# Patient Record
Sex: Female | Born: 1990
Health system: Southern US, Community
[De-identification: ages and names within clinical notes are randomized; demographics above are authoritative.]

## PROBLEM LIST (undated history)

## (undated) DIAGNOSIS — F419 Anxiety disorder, unspecified: Secondary | ICD-10-CM

## (undated) DIAGNOSIS — L709 Acne, unspecified: Secondary | ICD-10-CM

## (undated) DIAGNOSIS — F32A Depression, unspecified: Secondary | ICD-10-CM

## (undated) DIAGNOSIS — F329 Major depressive disorder, single episode, unspecified: Secondary | ICD-10-CM

## (undated) DIAGNOSIS — R519 Headache, unspecified: Secondary | ICD-10-CM

## (undated) DIAGNOSIS — N809 Endometriosis, unspecified: Secondary | ICD-10-CM

## (undated) DIAGNOSIS — R51 Headache: Secondary | ICD-10-CM

## (undated) HISTORY — DX: Acne, unspecified: L70.9

## (undated) HISTORY — DX: Depression, unspecified: F32.A

## (undated) HISTORY — PX: WISDOM TOOTH EXTRACTION: SHX21

## (undated) HISTORY — DX: Major depressive disorder, single episode, unspecified: F32.9

---
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
## 2012-07-30 ENCOUNTER — Other Ambulatory Visit (INDEPENDENT_AMBULATORY_CARE_PROVIDER_SITE_OTHER): Payer: 59

## 2012-07-30 ENCOUNTER — Ambulatory Visit (INDEPENDENT_AMBULATORY_CARE_PROVIDER_SITE_OTHER): Payer: 59 | Admitting: Internal Medicine

## 2012-07-30 ENCOUNTER — Encounter: Payer: Self-pay | Admitting: Internal Medicine

## 2012-07-30 VITALS — BP 110/70 | HR 71 | Temp 98.1°F | Ht 64.0 in | Wt 139.4 lb

## 2012-07-30 DIAGNOSIS — L709 Acne, unspecified: Secondary | ICD-10-CM | POA: Insufficient documentation

## 2012-07-30 DIAGNOSIS — L708 Other acne: Secondary | ICD-10-CM

## 2012-07-30 DIAGNOSIS — Z0000 Encounter for general adult medical examination without abnormal findings: Secondary | ICD-10-CM

## 2012-07-30 DIAGNOSIS — G43909 Migraine, unspecified, not intractable, without status migrainosus: Secondary | ICD-10-CM

## 2012-07-30 DIAGNOSIS — Z23 Encounter for immunization: Secondary | ICD-10-CM

## 2012-07-30 DIAGNOSIS — F329 Major depressive disorder, single episode, unspecified: Secondary | ICD-10-CM

## 2012-07-30 LAB — URINALYSIS, ROUTINE W REFLEX MICROSCOPIC
Bilirubin Urine: NEGATIVE
Ketones, ur: NEGATIVE
Leukocytes, UA: NEGATIVE
Urobilinogen, UA: 0.2 (ref 0.0–1.0)
pH: 6 (ref 5.0–8.0)

## 2012-07-30 LAB — LIPID PANEL
HDL: 65.1 mg/dL (ref >39.00)
Total CHOL/HDL Ratio: 2
VLDL: 19 mg/dL (ref 0.0–40.0)

## 2012-07-30 LAB — HEPATIC FUNCTION PANEL
AST: 19 U/L (ref 0–37)
Total Bilirubin: 0.3 mg/dL (ref 0.3–1.2)

## 2012-07-30 LAB — CBC WITH DIFFERENTIAL/PLATELET
Basophils Absolute: 0 10*3/uL (ref 0.0–0.1)
Eosinophils Absolute: 0.1 10*3/uL (ref 0.0–0.7)
HCT: 38.6 % (ref 36.0–46.0)
Hemoglobin: 12.8 g/dL (ref 12.0–15.0)
Lymphs Abs: 2.2 10*3/uL (ref 0.7–4.0)
MCHC: 33.3 g/dL (ref 30.0–36.0)
Neutro Abs: 4.1 10*3/uL (ref 1.4–7.7)
RDW: 13.7 % (ref 11.5–14.6)

## 2012-07-30 LAB — BASIC METABOLIC PANEL
CO2: 25 mEq/L (ref 19–32)
Glucose, Bld: 69 mg/dL — ABNORMAL LOW (ref 70–99)
Potassium: 4.5 mEq/L (ref 3.5–5.1)
Sodium: 137 mEq/L (ref 135–145)

## 2012-07-30 MED ORDER — METRONIDAZOLE 1 % EX GEL: Freq: Every day | CUTANEOUS | Status: DC

## 2012-07-30 NOTE — Assessment & Plan Note (Signed)
Never on meds for same - in counseling prn - at ome and at school ([employer]) Current symptoms stable - Pt agrees to call if change or problems

## 2012-07-30 NOTE — Progress Notes (Signed)
  Subjective:    Patient ID: Jennifer Parks, female    DOB: 01/27/1991, 21 y.o.   MRN: [id number]  HPI  New pt to me and our practice, here to establish care patient is here today for annual physical. Patient feels well overall  Concerned about acne- face only On Duac wash until summer 2013 with good control -  recent flare in past 4 weeks prompting resumed med use but "not working like before"  Past Medical History  Diagnosis Date  . Migraines   . Depression    Family History  Problem Relation Age of Onset  . Mental illness Mother   . Hyperlipidemia Father   . Lung cancer Maternal Grandfather   . Mental illness Paternal Grandmother   . Hyperlipidemia Paternal Grandfather   . Heart disease Paternal Grandfather    History  Substance Use Topics  . Smoking status: Never Smoker   . Smokeless tobacco: Not on file     Comment: single - student at [employer]  . Alcohol Use: Yes    Review of Systems Constitutional: Negative for fever or weight change.  Respiratory: Negative for cough and shortness of breath.   Cardiovascular: Negative for chest pain or palpitations.  Gastrointestinal: Negative for abdominal pain, no bowel changes.  Musculoskeletal: Negative for gait problem or joint swelling.  Skin: Negative for rash.  Neurological: Negative for dizziness or headache.  No other specific complaints in a complete review of systems (except as listed in HPI above).     Objective:   Physical Exam BP 110/70  Pulse 71  Temp 98.1 F (36.7 C) (Oral)  Ht 5\' 4"  (1.626 m)  Wt 139 lb 6.4 oz (63.231 kg)  BMI 23.93 kg/m2  SpO2 98% Wt Readings from Last 3 Encounters:  07/30/12 139 lb 6.4 oz (63.231 kg)   Constitutional: She appears well-developed and well-nourished. No distress.  HENT: Head: Normocephalic and atraumatic. Ears: B TMs ok, no erythema or effusion; Nose: Nose normal. Mouth/Throat: Oropharynx is clear and moist. No oropharyngeal exudate.  Eyes: Conjunctivae and EOM are  normal. Pupils are equal, round, and reactive to light. No scleral icterus.  Neck: Normal range of motion. Neck supple. No JVD present. No thyromegaly present.  Cardiovascular: Normal rate, regular rhythm and normal heart sounds.  No murmur heard. No BLE edema. Pulmonary/Chest: Effort normal and breath sounds normal. No respiratory distress. She has no wheezes.  Abdominal: Soft. Bowel sounds are normal. She exhibits no distension. There is no tenderness. no masses Musculoskeletal: Normal range of motion, no joint effusions. No gross deformities Neurological: She is alert and oriented to person, place, and time. No cranial nerve deficit. Coordination normal.  Skin: acne - lower face without cellulitis -remaining skin is warm and dry. No rash noted. No erythema.  Psychiatric: She has a normal mood and affect. Her behavior is normal. Judgment and thought content normal.   No results found for this basename: WBC, HGB, HCT, PLT, GLUCOSE, CHOL, TRIG, HDL, LDLDIRECT, LDLCALC, ALT, AST, NA, K, CL, CREATININE, BUN, CO2, TSH, PSA, INR, GLUF, HGBA1C, MICROALBUR       Assessment & Plan:   CPX/v70.0- Patient has been counseled on age-appropriate routine health concerns for screening and prevention. These are reviewed and up-to-date. Immunizations are up-to-date or declined. Labs ordered and reviewed.

## 2012-07-30 NOTE — Assessment & Plan Note (Signed)
On OCP - likely precipitated with weather change and yearend semester stressors Change duac to metrogel - erx done Education and reassurance provided

## 2012-07-30 NOTE — Assessment & Plan Note (Signed)
Precipitated by accidental trauma/mild concussion early 2012 (snowboard injury) S/p neuro eval ([facility]) Started topamax summer 2013 - much improved with rare incidents The current medical regimen is effective;  continue present plan and medications.

## 2012-07-30 NOTE — Patient Instructions (Signed)
It was good to see you today. We have reviewed your prior records including labs and tests today Health Maintenance reviewed - check on date of your last tetanus shot -flu shot today -all other recommended immunizations and age-appropriate screenings are up-to-date. Test(s) ordered today. Your results will be released to MyChart (or called to you) after review, usually within 72hours after test completion. If any changes need to be made, you will be notified at that same time. Start Metrogel daily for acne - Your prescription(s) have been submitted to your pharmacy. Please take as directed and contact our office if you believe you are having problem(s) with the medication(s). Other Medications reviewed, no other changes at this time.  Continue working with your other specialists Please schedule followup in 1-2 years, call sooner if problems. Health Maintenance, 56- to 20-Year-Old SCHOOL PERFORMANCE After high school completion, the young adult may be attending college, Scientist, product/process development or vocational school, or entering the [employer] or the work force. SOCIAL AND EMOTIONAL DEVELOPMENT The young adult establishes adult relationships and explores sexual identity. Young adults may be living at home or in a college dorm or apartment. Increasing independence is important with young adults. Throughout adolescence, teens should assume responsibility of their own health care. IMMUNIZATIONS Most young adults should be fully vaccinated. A booster dose of Tdap (tetanus, diphtheria, and pertussis, or "whooping cough"), a dose of meningococcal vaccine to protect against a certain type of bacterial meningitis, hepatitis A, human papillomarvirus (HPV), chickenpox, or measles vaccines may be indicated, if not given at an earlier age. Annual influenza or "flu" vaccination should be considered during flu season.   TESTING Annual screening for vision and hearing problems is recommended. Vision should be screened objectively at  least once between 81 and 36 years of age. The young adult may be screened for anemia or tuberculosis. Young adults should have a blood test to check for high cholesterol during this time period. Young adults should be screened for use of alcohol and drugs. If the young adult is sexually active, screening for sexually transmitted infections, pregnancy, or HIV may be performed. Screening for cervical cancer should be performed within 3 years of beginning sexual activity. NUTRITION AND ORAL HEALTH  Adequate calcium intake is important. Consume 3 servings of low-fat milk and dairy products daily. For those who do not drink milk or consume dairy products, calcium enriched foods, such as juice, bread, or cereal, dark, leafy greens, or canned fish are alternate sources of calcium.   Drink plenty of water. Limit fruit juice to 8 to 12 ounces per day. Avoid sugary beverages or sodas.   Discourage skipping meals, especially breakfast. Teens should eat a good variety of vegetables and fruits, as well as lean meats.   Avoid high fat, high salt, and high sugar foods, such as candy, chips, and cookies.   Encourage young adults to participate in meal planning and preparation.   Eat meals together as a family whenever possible. Encourage conversation at mealtime.   Limit fast food choices and eating out at restaurants.   Brush teeth twice a day and floss.   Schedule dental exams twice a year.  SLEEP Regular sleep habits are important. PHYSICAL, SOCIAL, AND EMOTIONAL DEVELOPMENT  One hour of regular physical activity daily is recommended. Continue to participate in sports.   Encourage young adults to develop their own interests and consider community service or volunteerism.   Provide guidance to the young adult in making decisions about college and work plans.  Make sure that young adults know that they should never be in a situation that makes them uncomfortable, and they should tell partners if  they do not want to engage in sexual activity.   Talk to the young adult about body image. Eating disorders may be noted at this time. Young adults may also be concerned about being overweight. Monitor the young adult for weight gain or loss.   Mood disturbances, depression, anxiety, alcoholism, or attention problems may be noted in young adults. Talk to the caregiver if there are concerns about mental illness.   Negotiate limit setting and independent decision making.   Encourage the young adult to handle conflict without physical violence.   Avoid loud noises which may impair hearing.   Limit television and computer time to 2 hours per day. Individuals who engage in excessive sedentary activity are more likely to become overweight.  RISK BEHAVIORS  Sexually active young adults need to take precautions against pregnancy and sexually transmitted infections. Talk to young adults about contraception.   Provide a tobacco-free and drug-free environment for the young adult. Talk to the young adult about drug, tobacco, and alcohol use among friends or at friends' homes. Make sure the young adult knows that smoking tobacco or marijuana and taking drugs have health consequences and may impact brain development.   Teach the young adult about appropriate use of over-the-counter or prescription medicines.   Establish guidelines for driving and for riding with friends.   Talk to young adults about the risks of drinking and driving or boating. Encourage the young adult to call you if he or she or friends have been drinking or using drugs.   Remind young adults to wear seat belts at all times in cars and life vests in boats.   Young adults should always wear a properly fitted helmet when they are riding a bicycle.   Use caution with all-terrain vehicles (ATVs) or other motorized vehicles.   Do not keep handguns in the home. (If you do, the gun and ammunition should be locked separately and out of  the young adult's access.)   Equip your home with smoke detectors and change the batteries regularly. Make sure all family members know the fire escape plans for your home.   Teach young adults not to swim alone and not to dive in shallow water.   All individuals should wear sunscreen that protects against UVA and UVB light with at least a sun protection factor (SPF) of 30 when out in the sun. This minimizes sun burning.  WHAT'S NEXT? Young adults should visit their pediatrician or family physician yearly. By young adulthood, health care should be transitioned to a family physician or internal medicine specialist. Sexually active females may want to begin annual physical exams with a gynecologist. Document Released: 10/24/2006 Document Revised: 10/21/2011 Document Reviewed: 11/13/2006 [facility] Patient Information 2013 [place], [place].   Acne Acne is a skin problem that causes pimples. Acne occurs when the pores in your skin get blocked. Your pores may become red, sore, and swollen (inflamed), or infected with a common skin bacterium (Propionibacterium acnes). Acne is a common skin problem. Up to 80% of people get acne at some time. Acne is especially common from the ages of 3 to 60. Acne usually goes away over time with proper treatment. CAUSES   Your pores each contain an oil gland. The oil glands make an oily substance called sebum. Acne happens when these glands get plugged with sebum, dead skin cells,  and dirt. The P. acnes bacteria that are normally found in the oil glands then multiply, causing inflammation. Acne is commonly triggered by changes in your hormones. These hormonal changes can cause the oil glands to get bigger and to make more sebum. Factors that can make acne worse include:  Hormone changes during adolescence.   Hormone changes during women's menstrual cycles.   Hormone changes during pregnancy.   Oil-based cosmetics and hair products.   Harshly scrubbing the skin.    Strong soaps.   Stress.   Hormone problems due to certain diseases.   Long or oily hair rubbing against the skin.   Certain medicines.   Pressure from headbands, backpacks, or shoulder pads.   Exposure to certain oils and chemicals.  SYMPTOMS   Acne often occurs on the face, neck, chest, and upper back. Symptoms include:  Small, red bumps (pimples or papules).   Whiteheads (closed comedones).   Blackheads (open comedones).   Small, pus-filled pimples (pustules).   Big, red pimples or pustules that feel tender.  More severe acne can cause:  An infected area that contains a collection of pus (abscess).   Hard, painful, fluid-filled sacs (cysts).   Scars.  DIAGNOSIS  Your caregiver can usually tell what the problem is by doing a physical exam. TREATMENT   There are many good treatments for acne. Some are available over-the-counter and some are available with a prescription. The treatment that is best for you depends on the type of acne you have and how severe it is. It may take 2 months of treatment before your acne gets better. Common treatments include:  Creams and lotions that prevent oil glands from clogging.   Creams and lotions that treat or prevent infections and inflammation.   Antibiotics applied to the skin or taken as a pill.   Pills that decrease sebum production.   Birth control pills.   Light or laser treatments.   Minor surgery.   Injections of medicine into the affected areas.   Chemicals that cause peeling of the skin.  HOME CARE INSTRUCTIONS   Good skin care is the most important part of treatment.  Wash your skin gently at least twice a day and after exercise. Always wash your skin before bed.   Use mild soap.   After each wash, apply a water-based skin moisturizer.   Keep your hair clean and off of your face. Shampoo your hair daily.   Only take medicines as directed by your caregiver.   Use a sunscreen or sunblock with SPF 30 or  greater. This is especially important when you are using acne medicines.   Choose cosmetics that are noncomedogenic. This means they do not plug the oil glands.   Avoid leaning your chin or forehead on your hands.   Avoid wearing tight headbands or hats.   Avoid picking or squeezing your pimples. This can make your acne worse and cause scarring.  SEEK MEDICAL CARE IF:    Your acne is not better after 8 weeks.   Your acne gets worse.   You have a large area of skin that is red or tender.  Document Released: 07/26/2000 Document Revised: 10/21/2011 Document Reviewed: 05/17/2011 [facility] Patient Information 2013 [place], [place].

## 2012-10-20 ENCOUNTER — Other Ambulatory Visit: Payer: Self-pay | Admitting: Internal Medicine

## 2013-01-29 ENCOUNTER — Encounter: Payer: Self-pay | Admitting: Family Medicine

## 2013-01-29 ENCOUNTER — Ambulatory Visit (INDEPENDENT_AMBULATORY_CARE_PROVIDER_SITE_OTHER): Payer: 59 | Admitting: Family Medicine

## 2013-01-29 VITALS — BP 98/74 | Temp 97.8°F | Wt 132.0 lb

## 2013-01-29 DIAGNOSIS — J069 Acute upper respiratory infection, unspecified: Secondary | ICD-10-CM

## 2013-01-29 NOTE — Progress Notes (Signed)
Chief Complaint  Patient presents with  . Sore Throat    tension headaches, cant keep temp regulated; chills, body ahes, fatigue     HPI:  22 yo patient of Dr. Felicity Coyer her for acute visit fore throat: -started: 2 days ago -symptoms: sore throat, fatigue, cough, sinus pain, drainage, body aches -denies: fever (temp of 99.5 of highest), SOB, NVD, tooth pain or ear pain, strep exposure -has tried:nasal decongestant  ROS: See pertinent positives and negatives per HPI.  Past Medical History  Diagnosis Date  . Migraines   . Depression   . Acne     Family History  Problem Relation Age of Onset  . Mental illness Mother   . Hyperlipidemia Father   . Lung cancer Maternal Grandfather   . Mental illness Paternal Grandmother   . Hyperlipidemia Paternal Grandfather   . Heart disease Paternal Grandfather     History   Social History  . Marital Status: Single    Spouse Name: N/A    Number of Children: N/A  . Years of Education: N/A   Social History Main Topics  . Smoking status: Never Smoker   . Smokeless tobacco: None     Comment: single - student at [employer]  . Alcohol Use: Yes  . Drug Use: No  . Sexually Active: None   Other Topics Concern  . None   Social History Narrative  . None    Current outpatient prescriptions:metroNIDAZOLE (METROGEL) 1 % gel, APPLY TOPICALLY DAILY., Disp: 60 g, Rfl: 5;  topiramate (TOPAMAX) 25 MG tablet, Take 25 mg by mouth daily., Disp: , Rfl: ;  zolmitriptan (ZOMIG) 5 MG tablet, Take 5 mg by mouth as needed., Disp: , Rfl: ;  drospirenone-ethinyl estradiol (OCELLA) 3-0.03 MG tablet, Take 1 tablet by mouth daily., Disp: , Rfl:   EXAM:  Filed Vitals:   01/29/13 1417  BP: 98/74  Temp: 97.8 F (36.6 C)    Body mass index is 22.65 kg/(m^2).  GENERAL: vitals reviewed and listed above, alert, oriented, appears well hydrated and in no acute distress  HEENT: atraumatic, conjunttiva clear, no obvious abnormalities on inspection of external nose  and ears, normal appearance of ear canals and TMs, clear nasal congestion, mild post oropharyngeal erythema with PND, no tonsillar edema or exudate, no sinus TTP  NECK: no obvious masses on inspection  LUNGS: clear to auscultation bilaterally, no wheezes, rales or rhonchi, good air movement  CV: HRRR, no peripheral edema  ABD: BS+, soft, NTTP, no rebound or guarding  MS: moves all extremities without noticeable abnormality  PSYCH: pleasant and cooperative, no obvious depression or anxiety  ASSESSMENT AND PLAN:  Discussed the following assessment and plan:  Upper respiratory infection  -viral per hx and exam, supportive care and return precautions -Patient advised to return or notify a doctor immediately if symptoms worsen or persist or new concerns arise.  Patient Instructions  INSTRUCTIONS FOR UPPER RESPIRATORY INFECTION:  -plenty of rest and fluids  -nasal saline wash 2-3 times daily (use prepackaged nasal saline or bottled/distilled water if making your own)   -can use sinex nasal spray for drainage and nasal congestion - but do NOT use longer then 3-4 days  -can use tylenol or ibuprofen as directed for aches and sorethroat  -in the winter time, using a humidifier at night is helpful (please follow cleaning instructions)  -if you are taking a cough medication - use only as directed, may also try a teaspoon of honey to coat the throat and throat  lozenges  -for sore throat, salt water gargles can help  -follow up if you have fevers, facial pain, tooth pain, difficulty breathing or are worsening or not getting better in 5-7 days      Joletta Manner R.

## 2013-01-29 NOTE — Patient Instructions (Signed)

## 2013-03-04 ENCOUNTER — Telehealth: Payer: Self-pay | Admitting: *Deleted

## 2013-03-04 NOTE — Telephone Encounter (Signed)
Noted thanks °

## 2013-03-04 NOTE — Telephone Encounter (Signed)
Pt called states she is experiencing excessive hair loss.  Pt scheduled OV for 7.28.14 at 9a.m.

## 2013-03-08 ENCOUNTER — Ambulatory Visit (INDEPENDENT_AMBULATORY_CARE_PROVIDER_SITE_OTHER): Payer: 59 | Admitting: Internal Medicine

## 2013-03-08 ENCOUNTER — Other Ambulatory Visit (INDEPENDENT_AMBULATORY_CARE_PROVIDER_SITE_OTHER): Payer: 59

## 2013-03-08 ENCOUNTER — Encounter: Payer: Self-pay | Admitting: Internal Medicine

## 2013-03-08 VITALS — BP 100/62 | HR 68 | Temp 98.4°F | Wt 136.1 lb

## 2013-03-08 DIAGNOSIS — L659 Nonscarring hair loss, unspecified: Secondary | ICD-10-CM

## 2013-03-08 DIAGNOSIS — G43909 Migraine, unspecified, not intractable, without status migrainosus: Secondary | ICD-10-CM

## 2013-03-08 LAB — TSH: TSH: 3.15 u[IU]/mL (ref 0.35–5.50)

## 2013-03-08 LAB — CBC WITH DIFFERENTIAL/PLATELET
Basophils Absolute: 0 10*3/uL (ref 0.0–0.1)
Basophils Relative: 0.5 % (ref 0.0–3.0)
Eosinophils Absolute: 0.2 10*3/uL (ref 0.0–0.7)
Lymphocytes Relative: 36.7 % (ref 12.0–46.0)
MCHC: 33.7 g/dL (ref 30.0–36.0)
Neutrophils Relative %: 51.3 % (ref 43.0–77.0)
RBC: 4.6 Mil/uL (ref 3.87–5.11)

## 2013-03-08 NOTE — Assessment & Plan Note (Signed)
Precipitated by accidental trauma/mild concussion early 2012 (snowboard injury) S/p neuro eval ([facility]) Started topamax summer 2013 - much improved with rare incidents/recurrent headache Discussed possibility of hair loss related to Topamax use - consider wean off med if continued problems with hair But until then, the current medical regimen is effective;  continue present plan and medications.

## 2013-03-08 NOTE — Progress Notes (Signed)
Subjective:    Patient ID: Jennifer Parks, female    DOB: June 29, 1991, 22 y.o.   MRN: [id number]  HPI Patient presents today with concerns about recent hair loss that she has noticed x 2-3 weeks. She claims that her hair comes out in clumps in the shower or when she is brushing it. She has noticed generalized thinning of her hair, but denies itching, redness, dryness, or flaking of her scalp. She admits to a change in her birth control pill from [place] to [place] in late May due to concerns over the increased risk of stroke. She also admits to increased fatigue x 1-2 months. Patient denies weight changes, intolerance to temperature change, changes in hair products, increased stress, and hair loss of other areas of body. She also denies personal or family history of thyroid problems and anemia.  Past Medical History  Diagnosis Date  . Migraines   . Depression   . Acne    Family History  Problem Relation Age of Onset  . Mental illness Mother   . Hyperlipidemia Father   . Lung cancer Maternal Grandfather   . Mental illness Paternal Grandmother   . Hyperlipidemia Paternal Grandfather   . Heart disease Paternal Grandfather     History  Substance Use Topics  . Smoking status: Never Smoker   . Smokeless tobacco: Not on file     Comment: single - student at [employer]  . Alcohol Use: Yes    Review of Systems: Negative or as mentioned in the HPI.      Objective:   Physical Exam  Constitutional: She is oriented to person, place, and time. She appears well-developed and well-nourished.  Neck: Normal range of motion. Neck supple.  Cardiovascular: Normal rate and regular rhythm.   Pulmonary/Chest: Breath sounds normal.  Neurological: She is alert and oriented to person, place, and time.  Skin: Skin is warm and dry.  Mild diffuse hair loss of scalp noted, especially in peri-auricular areas. No round patches of hair loss on scalp noted. No scaling, dryness, erythema or irritation noted on scalp.  No noted hair loss of eyebrows or body hair. No hirsutism noted.   Psychiatric: She has a normal mood and affect.     Lab Results  Component Value Date   WBC 7.3 07/30/2012   HGB 12.8 07/30/2012   HCT 38.6 07/30/2012   PLT 319.0 07/30/2012   GLUCOSE 69* 07/30/2012   CHOL 157 07/30/2012   TRIG 95.0 07/30/2012   HDL 65.10 07/30/2012   LDLCALC 73 07/30/2012   ALT 10 07/30/2012   AST 19 07/30/2012   NA 137 07/30/2012   K 4.5 07/30/2012   CL 105 07/30/2012   CREATININE 0.8 07/30/2012   BUN 15 07/30/2012   CO2 25 07/30/2012   TSH 1.95 07/30/2012        Assessment & Plan:  1. Diffuse hair loss of scalp- Most likely due to change in hormones from the recent birth control change. It was explained to the patient that the hair loss could also be due to normal physiological changes. TSH, CBC, and ferritin will be checked for possibility of thyroid issue or iron deficiency. Patient is counseled to follow up if hair loss worsens or continues over the next 1-2 months.  Concha Se, [place] Neighbor  I have personally reviewed this case with PA student. I also personally examined this patient. I agree with history and findings as documented above. I reviewed, discussed and approve of the assessment and plan as listed  above. Rene Paci, MD

## 2013-03-08 NOTE — Patient Instructions (Signed)
It was good to see you today. We have reviewed your prior records including labs and tests today Test(s) ordered today. Your results will be released to MyChart (or called to you) after review, usually within 72hours after test completion. If any changes need to be made, you will be notified at that same time. Avoid stressors on your hair such as chemical treatments, coloring, "ponytails" Likely thinning changes related to change in hormones and seasonal/stressors of environment - Let us know if persisting changes or worsening symptoms over next 3 months

## 2013-03-08 NOTE — Progress Notes (Signed)
  Subjective:    Patient ID: Jennifer Parks, female    DOB: Jan 20, 1991, 21 y.o.   MRN: [id number]  HPI  Concerned about hair loss  Past Medical History  Diagnosis Date  . Migraines   . Depression   . Acne     Review of Systems  Constitutional: Negative for fever, fatigue and unexpected weight change.  Endocrine: Negative for polydipsia and polyuria.  Skin: Negative for rash and wound.       Objective:   Physical Exam BP 100/62  Pulse 68  Temp(Src) 98.4 F (36.9 C) (Oral)  Wt 136 lb 1.9 oz (61.744 kg)  BMI 23.35 kg/m2  SpO2 98% Wt Readings from Last 3 Encounters:  03/08/13 136 lb 1.9 oz (61.744 kg)  01/29/13 132 lb (59.875 kg)  07/30/12 139 lb 6.4 oz (63.231 kg)   Constitutional: She appears well-developed and well-nourished. No distress.  Neck: Normal range of motion. Neck supple. No JVD present. No thyromegaly present.  Cardiovascular: Normal rate, regular rhythm and normal heart sounds.  No murmur heard. No BLE edema. Pulmonary/Chest: Effort normal and breath sounds normal. No respiratory distress. She has no wheezes.  Skin: mildly diffuse thinning of scalp hair, esp B postauricular region - no thinning eyebrows, no excessive facial hair - Skin is warm and dry. No rash noted. No erythema.  Psychiatric: She has a normal mood and affect. Her behavior is normal. Judgment and thought content normal.   Lab Results  Component Value Date   WBC 7.3 07/30/2012   HGB 12.8 07/30/2012   HCT 38.6 07/30/2012   PLT 319.0 07/30/2012   GLUCOSE 69* 07/30/2012   CHOL 157 07/30/2012   TRIG 95.0 07/30/2012   HDL 65.10 07/30/2012   LDLCALC 73 07/30/2012   ALT 10 07/30/2012   AST 19 07/30/2012   NA 137 07/30/2012   K 4.5 07/30/2012   CL 105 07/30/2012   CREATININE 0.8 07/30/2012   BUN 15 07/30/2012   CO2 25 07/30/2012   TSH 1.95 07/30/2012         Assessment & Plan:   Hair loss - no concerning features on hx or exam, no alopecia, +recent med changes 2 mo ago to  different BCP Check TSH and ferritin/CBC The patient is reassured that these symptoms do not appear to represent a serious or threatening condition.

## 2013-08-11 ENCOUNTER — Encounter: Payer: Self-pay | Admitting: Internal Medicine

## 2013-08-11 ENCOUNTER — Ambulatory Visit (INDEPENDENT_AMBULATORY_CARE_PROVIDER_SITE_OTHER): Payer: 59 | Admitting: Internal Medicine

## 2013-08-11 VITALS — BP 124/80 | HR 89 | Temp 98.5°F | Ht 64.0 in | Wt 146.0 lb

## 2013-08-11 DIAGNOSIS — J019 Acute sinusitis, unspecified: Secondary | ICD-10-CM

## 2013-08-11 MED ORDER — LEVOFLOXACIN 250 MG PO TABS: 250.0000 mg | ORAL_TABLET | Freq: Every day | ORAL | Status: DC

## 2013-08-11 MED ORDER — HYDROCODONE-HOMATROPINE 5-1.5 MG/5ML PO SYRP: 5.0000 mL | ORAL_SOLUTION | Freq: Four times a day (QID) | ORAL | Status: DC | PRN

## 2013-08-11 NOTE — Progress Notes (Signed)
   Subjective:    Patient ID: Jennifer Parks, female    DOB: January 11, 1991, 22 y.o.   MRN: [id number]  HPI  Here with 2-3 days acute onset fever, facial pain, pressure, headache, general weakness and malaise, and greenish d/c, with mild ST and cough, but pt denies chest pain, wheezing, increased sob or doe, orthopnea, PND, increased LE swelling, palpitations, dizziness or syncope.   Pt denies polydipsia, polyuria.  Pt denies new neurological symptoms such as new headache, or facial or extremity weakness or numbness Past Medical History  Diagnosis Date  . Migraines   . Depression   . Acne    Past Surgical History  Procedure Laterality Date  . No past surgeries      reports that she has never smoked. She does not have any smokeless tobacco history on file. She reports that she drinks alcohol. She reports that she does not use illicit drugs. family history includes Heart disease in her paternal grandfather; Hyperlipidemia in her father and paternal grandfather; Lung cancer in her maternal grandfather; Mental illness in her mother and paternal grandmother. Allergies  Allergen Reactions  . Amoxicillin    Current Outpatient Prescriptions on File Prior to Visit  Medication Sig Dispense Refill  . drospirenone-ethinyl estradiol (OCELLA) 3-0.03 MG tablet Take 1 tablet by mouth daily.      . metroNIDAZOLE (METROGEL) 1 % gel APPLY TOPICALLY DAILY.  60 g  5  . topiramate (TOPAMAX) 50 MG tablet Take 50 mg by mouth daily.      Marland Kitchen zolmitriptan (ZOMIG) 5 MG tablet Take 5 mg by mouth as needed.       No current facility-administered medications on file prior to visit.    Review of Systems  All otherwise neg per pt    Objective:   Physical Exam BP 124/80  Pulse 89  Temp(Src) 98.5 F (36.9 C) (Oral)  Ht 5\' 4"  (1.626 m)  Wt 146 lb (66.225 kg)  BMI 25.05 kg/m2  SpO2 98% VS noted, mild ill Constitutional: Pt appears well-developed and well-nourished.  HENT: Head: NCAT.  Right Ear: External ear  normal.  Left Ear: External ear normal.  Bilat tm's with mild erythema.  Max sinus areas mod tender.  Pharynx with mild erythema, no exudate Eyes: Conjunctivae and EOM are normal. Pupils are equal, round, and reactive to light.  Neck: Normal range of motion. Neck supple.  Cardiovascular: Normal rate and regular rhythm.   Pulmonary/Chest: Effort normal and breath sounds normal.  - no rales or wheezing Neurological: Pt is alert. Not confused  Skin: Skin is warm. No erythema.  Psychiatric: Pt behavior is normal. Thought content normal.     Assessment & Plan:

## 2013-08-11 NOTE — Progress Notes (Signed)
Pre visit review using our clinic review tool, if applicable. No additional management support is needed unless otherwise documented below in the visit note. 

## 2013-08-11 NOTE — Assessment & Plan Note (Signed)
Mild to mod, for antibx course,  to f/u any worsening symptoms or concerns 

## 2013-08-11 NOTE — Patient Instructions (Signed)
Please take all new medication as prescribed - the antibiotic, and cough medicine if needed Please continue all other medications as before, and refills have been done if requested. You can also take Delsym OTC for cough, and/or Mucinex (or it's generic off brand) for congestion, and tylenol as needed for pain.

## 2013-08-16 ENCOUNTER — Telehealth: Payer: Self-pay | Admitting: *Deleted

## 2013-08-16 NOTE — Telephone Encounter (Signed)
Pt called states Hycodan gave her a Migraine once she stopped taking medication.  Please advise

## 2013-08-16 NOTE — Telephone Encounter (Signed)
?   Is she still taking Hycodan ?Is she still having headache -  ?has she taken migraine meds? I need more information

## 2013-08-17 MED ORDER — BUTALBITAL-APAP-CAFFEINE 50-325-40 MG PO TABS: 1.0000 | ORAL_TABLET | ORAL | Status: DC | PRN

## 2013-08-17 NOTE — Telephone Encounter (Signed)
Spoke with pt states she stopped the Hycodan on Saturday.  She states she does still the Migraine.  She does take Migraine medication.  Please advise

## 2013-08-17 NOTE — Telephone Encounter (Signed)
Patient states she is still taking topamax, quit taking the hycodan on Saturday and has had constant migraine since Sunday. Patient is wanting to know if her abrupt stopping the hycodan is what is causing her present migraine....states she was not calling to request further medication.  She wishes to either speak with you personally or have her advised....please advise.   CB# [phone]

## 2013-08-17 NOTE — Telephone Encounter (Signed)
Esgic as needed for headache - rx printed to fax - placed on Lucy's desk

## 2013-08-18 NOTE — Telephone Encounter (Signed)
Spoke with pt advised of MDs message 

## 2013-08-18 NOTE — Telephone Encounter (Signed)
No - stopping the Hycodan abruptly did not cause migraine - likely it is the underlying sinus infection 9as per evaluation by Dr Jonny RuizJohn for this problem) causing the headache If pain is unrelieved with current medications, pt should schedule OV f/u for headache  thanks

## 2013-09-06 ENCOUNTER — Encounter: Payer: Self-pay | Admitting: Internal Medicine

## 2013-09-06 ENCOUNTER — Ambulatory Visit (INDEPENDENT_AMBULATORY_CARE_PROVIDER_SITE_OTHER): Payer: 59 | Admitting: Internal Medicine

## 2013-09-06 VITALS — BP 112/68 | HR 90 | Temp 99.3°F | Resp 16

## 2013-09-06 DIAGNOSIS — J020 Streptococcal pharyngitis: Secondary | ICD-10-CM

## 2013-09-06 MED ORDER — AZITHROMYCIN 500 MG PO TABS: 500.0000 mg | ORAL_TABLET | Freq: Every day | ORAL | Status: DC

## 2013-09-06 NOTE — Progress Notes (Signed)
Subjective:    Patient ID: Jennifer Parks, female    DOB: [birth year], 23 y.o.   MRN: [id number]  Sore Throat  This is a new problem. The current episode started yesterday. The problem has been gradually worsening. Neither side of throat is experiencing more pain than the other. The maximum temperature recorded prior to her arrival was 100 - 100.9 F. The fever has been present for 1 to 2 days. The pain is at a severity of 1/10. The pain is mild. Associated symptoms include headaches and swollen glands. Pertinent negatives include no abdominal pain, congestion, coughing, diarrhea, drooling, ear discharge, ear pain, hoarse voice, plugged ear sensation, neck pain, shortness of breath, stridor, trouble swallowing or vomiting. She has had exposure to strep. She has tried acetaminophen and NSAIDs for the symptoms. The treatment provided mild relief.      Review of Systems  Constitutional: Positive for fever, chills and fatigue. Negative for diaphoresis, activity change, appetite change and unexpected weight change.  HENT: Positive for sore throat. Negative for congestion, drooling, ear discharge, ear pain, hoarse voice, nosebleeds, postnasal drip, rhinorrhea, sinus pressure, tinnitus and trouble swallowing.   Eyes: Negative.   Respiratory: Negative.  Negative for cough, shortness of breath and stridor.   Cardiovascular: Negative.   Gastrointestinal: Negative.  Negative for vomiting, abdominal pain and diarrhea.  Endocrine: Negative.   Genitourinary: Negative.   Musculoskeletal: Negative.  Negative for arthralgias, myalgias and neck pain.  Skin: Negative.  Negative for color change, rash and wound.  Allergic/Immunologic: Negative.   Neurological: Positive for headaches.  Hematological: Negative.  Negative for adenopathy. Does not bruise/bleed easily.  Psychiatric/Behavioral: Negative.        Objective:   Physical Exam  Vitals reviewed. Constitutional: She is oriented to person, place, and  time. She appears well-developed and well-nourished.  Non-toxic appearance. She does not have a sickly appearance. She does not appear ill. No distress.  HENT:  Head: Normocephalic and atraumatic.  Right Ear: Hearing, tympanic membrane, external ear and ear canal normal.  Left Ear: Hearing, tympanic membrane, external ear and ear canal normal.  Mouth/Throat: Mucous membranes are not pale, not dry and not cyanotic. Posterior oropharyngeal erythema present. No oropharyngeal exudate, posterior oropharyngeal edema or tonsillar abscesses.  Eyes: Conjunctivae are normal. Right eye exhibits no discharge. Left eye exhibits no discharge. No scleral icterus.  Neck: Normal range of motion. Neck supple. No JVD present. No tracheal deviation present. No thyromegaly present.  Cardiovascular: Normal rate, regular rhythm, normal heart sounds and intact distal pulses.  Exam reveals no gallop and no friction rub.   No murmur heard. Pulmonary/Chest: Effort normal and breath sounds normal. No stridor. No respiratory distress. She has no wheezes. She has no rales. She exhibits no tenderness.  Abdominal: Soft. Bowel sounds are normal. She exhibits no distension and no mass. There is no tenderness. There is no rebound and no guarding.  Musculoskeletal: Normal range of motion. She exhibits no edema and no tenderness.  Lymphadenopathy:    She has cervical adenopathy.  Neurological: She is oriented to person, place, and time.  Skin: Skin is warm and dry. No rash noted. She is not diaphoretic. No erythema. No pallor.     Lab Results  Component Value Date   WBC 7.4 03/08/2013   HGB 13.7 03/08/2013   HCT 40.8 03/08/2013   PLT 329.0 03/08/2013   GLUCOSE 69* 07/30/2012   CHOL 157 07/30/2012   TRIG 95.0 07/30/2012   HDL 65.10 07/30/2012  LDLCALC 73 07/30/2012   ALT 10 07/30/2012   AST 19 07/30/2012   NA 137 07/30/2012   K 4.5 07/30/2012   CL 105 07/30/2012   CREATININE 0.8 07/30/2012   BUN 15 07/30/2012   CO2 25  07/30/2012   TSH 3.15 03/08/2013       Assessment & Plan:

## 2013-09-06 NOTE — Assessment & Plan Note (Signed)
She has several features c/w strep throat so I have asked her to take Zithromax

## 2013-09-06 NOTE — Progress Notes (Signed)
Pre visit review using our clinic review tool, if applicable. No additional management support is needed unless otherwise documented below in the visit note. 

## 2013-09-06 NOTE — Patient Instructions (Signed)
Strep Throat  Strep throat is an infection of the throat caused by a bacteria named Streptococcus pyogenes. Your caregiver may call the infection streptococcal "tonsillitis" or "pharyngitis" depending on whether there are signs of inflammation in the tonsils or back of the throat. Strep throat is most common in children aged 22 15 years during the cold months of the year, but it can occur in people of any age during any season. This infection is spread from person to person (contagious) through coughing, sneezing, or other close contact.  SYMPTOMS   · Fever or chills.  · Painful, swollen, red tonsils or throat.  · Pain or difficulty when swallowing.  · White or yellow spots on the tonsils or throat.  · Swollen, tender lymph nodes or "glands" of the neck or under the jaw.  · Red rash all over the body (rare).  DIAGNOSIS   Many different infections can cause the same symptoms. A test must be done to confirm the diagnosis so the right treatment can be given. A "rapid strep test" can help your caregiver make the diagnosis in a few minutes. If this test is not available, a light swab of the infected area can be used for a throat culture test. If a throat culture test is done, results are usually available in a day or two.  TREATMENT   Strep throat is treated with antibiotic medicine.  HOME CARE INSTRUCTIONS   · Gargle with 1 tsp of salt in 1 cup of warm water, 3 4 times per day or as needed for comfort.  · Family members who also have a sore throat or fever should be tested for strep throat and treated with antibiotics if they have the strep infection.  · Make sure everyone in your household washes their hands well.  · Do not share food, drinking cups, or personal items that could cause the infection to spread to others.  · You may need to eat a soft food diet until your sore throat gets better.  · Drink enough water and fluids to keep your urine clear or pale yellow. This will help prevent dehydration.  · Get plenty of  rest.  · Stay home from school, daycare, or work until you have been on antibiotics for 24 hours.  · Only take over-the-counter or prescription medicines for pain, discomfort, or fever as directed by your caregiver.  · If antibiotics are prescribed, take them as directed. Finish them even if you start to feel better.  SEEK MEDICAL CARE IF:   · The glands in your neck continue to enlarge.  · You develop a rash, cough, or earache.  · You cough up green, yellow-brown, or bloody sputum.  · You have pain or discomfort not controlled by medicines.  · Your problems seem to be getting worse rather than better.  SEEK IMMEDIATE MEDICAL CARE IF:   · You develop any new symptoms such as vomiting, severe headache, stiff or painful neck, chest pain, shortness of breath, or trouble swallowing.  · You develop severe throat pain, drooling, or changes in your voice.  · You develop swelling of the neck, or the skin on the neck becomes red and tender.  · You have a fever.  · You develop signs of dehydration, such as fatigue, dry mouth, and decreased urination.  · You become increasingly sleepy, or you cannot wake up completely.  Document Released: 07/26/2000 Document Revised: 07/15/2012 Document Reviewed: 09/27/2010  [employer]® Patient Information ©2014 [employer].

## 2013-10-25 NOTE — Patient Instructions (Addendum)
   Your procedure is scheduled on: Tuesday, Mar 31  [facility] at: 6 AM Pick up the phone at the desk and dial [phone] and inform us of your arrival.  Please call this number if you have any problems the morning of surgery: [phone]  Remember: Do not eat or drink after midnight: Monday Take these medicines the morning of surgery with a SIP OF WATER:  Topamax, birth control pill  Do not wear jewelry, make-up, or FINGER nail polish No metal in your hair or on your body. Do not wear lotions, powders, perfumes.  You may wear deodorant.  Do not bring valuables to the hospital. Contacts, dentures or bridgework may not be worn into surgery.  Patients discharged on the day of surgery will not be allowed to drive home.  Home with mother Ottie GlazierBarbara [facility] cell [phone].

## 2013-10-26 ENCOUNTER — Encounter (HOSPITAL_COMMUNITY): Payer: Self-pay

## 2013-10-26 ENCOUNTER — Other Ambulatory Visit: Payer: Self-pay | Admitting: Internal Medicine

## 2013-10-26 ENCOUNTER — Encounter (HOSPITAL_COMMUNITY)
Admission: RE | Admit: 2013-10-26 | Discharge: 2013-10-26 | Disposition: A | Discharge status: Home / Self Care | Payer: 59 | Source: Ambulatory Visit | Attending: Obstetrics and Gynecology | Admitting: Obstetrics and Gynecology

## 2013-10-26 DIAGNOSIS — Z01812 Encounter for preprocedural laboratory examination: Secondary | ICD-10-CM | POA: Insufficient documentation

## 2013-10-26 HISTORY — DX: Anxiety disorder, unspecified: F41.9

## 2013-10-26 LAB — CBC
HEMATOCRIT: 40.5 % (ref 36.0–46.0)
HEMOGLOBIN: 13.9 g/dL (ref 12.0–15.0)
MCH: 30.3 pg (ref 26.0–34.0)
MCHC: 34.3 g/dL (ref 30.0–36.0)
MCV: 88.2 fL (ref 78.0–100.0)
Platelets: 295 10*3/uL (ref 150–400)
RBC: 4.59 MIL/uL (ref 3.87–5.11)
RDW: 13.1 % (ref 11.5–15.5)
WBC: 8.9 10*3/uL (ref 4.0–10.5)

## 2013-11-08 MED ORDER — LACTATED RINGERS IV SOLN: INTRAVENOUS | Status: DC

## 2013-11-08 MED ORDER — GENTAMICIN SULFATE 40 MG/ML IJ SOLN
INTRAVENOUS | Status: AC
  Administered 2013-11-09: 100 mL via INTRAVENOUS
  Filled 2013-11-08: qty 7.5

## 2013-11-08 NOTE — H&P (Addendum)
23 yo G0 with severe dysmenorrhea and chronic pelvic pain despite medical mngt presents for surgical mngt.  PMHx:  Migraines PSHx:  Negative SHx:  negative tobacco and etoh Meds:  Topamax, zomig, MTV, OCP FHx:  Lung ca, bladder ca Allergies:  PCN   AF, VSS gen- NAD Abd - soft, NT/ND CV - RRR Lungs - clear PV - uterus tender with manipulation, no adnexal masses  US - normal appearing uterus and ovaries.  No free fluid  A/P:  Chronic pelvic pain Diagnostic laparoscopy with possible fulgeration of endometriosis R/b/a discussed, questions answered, informed consent

## 2013-11-09 ENCOUNTER — Ambulatory Visit (HOSPITAL_COMMUNITY): Payer: 59 | Admitting: Anesthesiology

## 2013-11-09 ENCOUNTER — Encounter (HOSPITAL_COMMUNITY)
Admission: RE | Disposition: A | Discharge status: Home / Self Care | Payer: Self-pay | Source: Ambulatory Visit | Attending: Obstetrics and Gynecology

## 2013-11-09 ENCOUNTER — Encounter (HOSPITAL_COMMUNITY): Payer: Self-pay | Admitting: *Deleted

## 2013-11-09 ENCOUNTER — Ambulatory Visit (HOSPITAL_COMMUNITY)
Admission: RE | Admit: 2013-11-09 | Discharge: 2013-11-09 | Disposition: A | Discharge status: Home / Self Care | Payer: 59 | Source: Ambulatory Visit | Attending: Obstetrics and Gynecology | Admitting: Obstetrics and Gynecology

## 2013-11-09 ENCOUNTER — Encounter (HOSPITAL_COMMUNITY): Payer: 59 | Admitting: Anesthesiology

## 2013-11-09 DIAGNOSIS — G8929 Other chronic pain: Secondary | ICD-10-CM | POA: Insufficient documentation

## 2013-11-09 DIAGNOSIS — N803 Endometriosis of pelvic peritoneum, unspecified: Secondary | ICD-10-CM | POA: Insufficient documentation

## 2013-11-09 DIAGNOSIS — N801 Endometriosis of ovary: Secondary | ICD-10-CM | POA: Insufficient documentation

## 2013-11-09 DIAGNOSIS — N946 Dysmenorrhea, unspecified: Secondary | ICD-10-CM | POA: Insufficient documentation

## 2013-11-09 DIAGNOSIS — N949 Unspecified condition associated with female genital organs and menstrual cycle: Secondary | ICD-10-CM | POA: Insufficient documentation

## 2013-11-09 DIAGNOSIS — N80109 Endometriosis of ovary, unspecified side, unspecified depth: Secondary | ICD-10-CM | POA: Insufficient documentation

## 2013-11-09 HISTORY — PX: LAPAROSCOPY: SHX197

## 2013-11-09 LAB — PREGNANCY, URINE: Preg Test, Ur: NEGATIVE

## 2013-11-09 SURGERY — LAPAROSCOPY OPERATIVE
Anesthesia: General | Site: Abdomen

## 2013-11-09 MED ORDER — ONDANSETRON HCL 4 MG/2ML IJ SOLN
INTRAMUSCULAR | Status: AC
  Filled 2013-11-09: qty 2

## 2013-11-09 MED ORDER — FENTANYL CITRATE 0.05 MG/ML IJ SOLN
INTRAMUSCULAR | Status: DC | PRN
Start: 2013-11-09 — End: 2013-11-09
  Administered 2013-11-09: 50 ug via INTRAVENOUS
  Administered 2013-11-09: 100 ug via INTRAVENOUS
  Administered 2013-11-09: 50 ug via INTRAVENOUS

## 2013-11-09 MED ORDER — OXYCODONE-ACETAMINOPHEN 5-325 MG PO TABS: 1.0000 | ORAL_TABLET | ORAL | Status: DC | PRN

## 2013-11-09 MED ORDER — MEPERIDINE HCL 25 MG/ML IJ SOLN: 6.2500 mg | INTRAMUSCULAR | Status: DC | PRN

## 2013-11-09 MED ORDER — LIDOCAINE HCL (CARDIAC) 20 MG/ML IV SOLN
INTRAVENOUS | Status: DC | PRN
  Administered 2013-11-09: 100 mg via INTRAVENOUS

## 2013-11-09 MED ORDER — FENTANYL CITRATE 0.05 MG/ML IJ SOLN
INTRAMUSCULAR | Status: AC
  Filled 2013-11-09: qty 5

## 2013-11-09 MED ORDER — PROMETHAZINE HCL 25 MG/ML IJ SOLN: 6.2500 mg | INTRAMUSCULAR | Status: DC | PRN

## 2013-11-09 MED ORDER — PROPOFOL 10 MG/ML IV BOLUS
INTRAVENOUS | Status: DC | PRN
  Administered 2013-11-09: 150 mg via INTRAVENOUS

## 2013-11-09 MED ORDER — LACTATED RINGERS IV SOLN
INTRAVENOUS | Status: DC
  Administered 2013-11-09 (×3): via INTRAVENOUS

## 2013-11-09 MED ORDER — NEOSTIGMINE METHYLSULFATE 1 MG/ML IJ SOLN
INTRAMUSCULAR | Status: DC | PRN
  Administered 2013-11-09: 4 mg via INTRAVENOUS

## 2013-11-09 MED ORDER — KETOROLAC TROMETHAMINE 30 MG/ML IJ SOLN
INTRAMUSCULAR | Status: AC
  Filled 2013-11-09: qty 1

## 2013-11-09 MED ORDER — MIDAZOLAM HCL 2 MG/2ML IJ SOLN: 0.5000 mg | Freq: Once | INTRAMUSCULAR | Status: DC | PRN

## 2013-11-09 MED ORDER — GLYCOPYRROLATE 0.2 MG/ML IJ SOLN
INTRAMUSCULAR | Status: DC | PRN
  Administered 2013-11-09: 0.6 mg via INTRAVENOUS

## 2013-11-09 MED ORDER — MIDAZOLAM HCL 2 MG/2ML IJ SOLN
INTRAMUSCULAR | Status: DC | PRN
  Administered 2013-11-09: 2 mg via INTRAVENOUS

## 2013-11-09 MED ORDER — ROCURONIUM BROMIDE 100 MG/10ML IV SOLN
INTRAVENOUS | Status: AC
  Filled 2013-11-09: qty 1

## 2013-11-09 MED ORDER — BUPIVACAINE HCL (PF) 0.25 % IJ SOLN
INTRAMUSCULAR | Status: DC | PRN
  Administered 2013-11-09: 5 mL

## 2013-11-09 MED ORDER — KETOROLAC TROMETHAMINE 30 MG/ML IJ SOLN
INTRAMUSCULAR | Status: DC | PRN
  Administered 2013-11-09: 30 mg via INTRAVENOUS

## 2013-11-09 MED ORDER — NEOSTIGMINE METHYLSULFATE 1 MG/ML IJ SOLN
INTRAMUSCULAR | Status: AC
  Filled 2013-11-09: qty 1

## 2013-11-09 MED ORDER — ONDANSETRON HCL 4 MG/2ML IJ SOLN
INTRAMUSCULAR | Status: DC | PRN
  Administered 2013-11-09: 4 mg via INTRAVENOUS

## 2013-11-09 MED ORDER — MIDAZOLAM HCL 2 MG/2ML IJ SOLN
INTRAMUSCULAR | Status: AC
  Filled 2013-11-09: qty 2

## 2013-11-09 MED ORDER — BUPIVACAINE HCL (PF) 0.25 % IJ SOLN
INTRAMUSCULAR | Status: AC
  Filled 2013-11-09: qty 30

## 2013-11-09 MED ORDER — DEXAMETHASONE SODIUM PHOSPHATE 10 MG/ML IJ SOLN
INTRAMUSCULAR | Status: DC | PRN
  Administered 2013-11-09: 10 mg via INTRAVENOUS

## 2013-11-09 MED ORDER — DEXAMETHASONE SODIUM PHOSPHATE 10 MG/ML IJ SOLN
INTRAMUSCULAR | Status: AC
  Filled 2013-11-09: qty 1

## 2013-11-09 MED ORDER — LIDOCAINE HCL (CARDIAC) 20 MG/ML IV SOLN
INTRAVENOUS | Status: AC
  Filled 2013-11-09: qty 5

## 2013-11-09 MED ORDER — ROCURONIUM BROMIDE 100 MG/10ML IV SOLN
INTRAVENOUS | Status: DC | PRN
  Administered 2013-11-09: 20 mg via INTRAVENOUS
  Administered 2013-11-09: 30 mg via INTRAVENOUS

## 2013-11-09 MED ORDER — FENTANYL CITRATE 0.05 MG/ML IJ SOLN: 25.0000 ug | INTRAMUSCULAR | Status: DC | PRN

## 2013-11-09 MED ORDER — PROPOFOL 10 MG/ML IV EMUL
INTRAVENOUS | Status: AC
  Filled 2013-11-09: qty 20

## 2013-11-09 MED ORDER — KETOROLAC TROMETHAMINE 30 MG/ML IJ SOLN: 15.0000 mg | Freq: Once | INTRAMUSCULAR | Status: DC | PRN

## 2013-11-09 MED ORDER — GLYCOPYRROLATE 0.2 MG/ML IJ SOLN
INTRAMUSCULAR | Status: AC
  Filled 2013-11-09: qty 3

## 2013-11-09 SURGICAL SUPPLY — 32 items
BAG SPEC RTRVL LRG 6X4 10 (ENDOMECHANICALS)
BARRIER ADHS 3X4 INTERCEED (GAUZE/BANDAGES/DRESSINGS) IMPLANT
BRR ADH 4X3 ABS CNTRL BYND (GAUZE/BANDAGES/DRESSINGS)
CABLE HIGH FREQUENCY MONO STRZ (ELECTRODE) IMPLANT
CATH ROBINSON RED A/P 16FR (CATHETERS) ×3 IMPLANT
CHLORAPREP W/TINT 26ML (MISCELLANEOUS) ×3 IMPLANT
CLOSURE WOUND 1/2 X4 (GAUZE/BANDAGES/DRESSINGS)
CLOTH BEACON ORANGE TIMEOUT ST (SAFETY) ×3 IMPLANT
DERMABOND ADVANCED (GAUZE/BANDAGES/DRESSINGS) ×2
DERMABOND ADVANCED .7 DNX12 (GAUZE/BANDAGES/DRESSINGS) ×1 IMPLANT
GLOVE BIO SURGEON STRL SZ 6.5 (GLOVE) ×2 IMPLANT
GLOVE BIO SURGEONS STRL SZ 6.5 (GLOVE) ×1
GLOVE BIOGEL PI IND STRL 7.0 (GLOVE) ×1 IMPLANT
GLOVE BIOGEL PI INDICATOR 7.0 (GLOVE) ×2
GOWN STRL REUS W/TWL LRG LVL3 (GOWN DISPOSABLE) ×6 IMPLANT
NS IRRIG 1000ML POUR BTL (IV SOLUTION) ×3 IMPLANT
PACK LAPAROSCOPY BASIN (CUSTOM PROCEDURE TRAY) ×3 IMPLANT
POUCH SPECIMEN RETRIEVAL 10MM (ENDOMECHANICALS) IMPLANT
PROTECTOR NERVE ULNAR (MISCELLANEOUS) ×3 IMPLANT
SEALER TISSUE G2 CVD JAW 35 (ENDOMECHANICALS) IMPLANT
SEALER TISSUE G2 CVD JAW 45CM (ENDOMECHANICALS) IMPLANT
SET IRRIG TUBING LAPAROSCOPIC (IRRIGATION / IRRIGATOR) IMPLANT
STRIP CLOSURE SKIN 1/2X4 (GAUZE/BANDAGES/DRESSINGS) IMPLANT
SUT VIC AB 3-0 PS2 18 (SUTURE) ×3
SUT VIC AB 3-0 PS2 18XBRD (SUTURE) ×1 IMPLANT
SUT VICRYL 0 UR6 27IN ABS (SUTURE) ×3 IMPLANT
TOWEL OR 17X24 6PK STRL BLUE (TOWEL DISPOSABLE) ×6 IMPLANT
TROCAR OPTI TIP 5M 100M (ENDOMECHANICALS) IMPLANT
TROCAR XCEL NON-BLD 11X100MML (ENDOMECHANICALS) ×6 IMPLANT
TROCAR XCEL OPT SLVE 5M 100M (ENDOMECHANICALS) IMPLANT
WARMER LAPAROSCOPE (MISCELLANEOUS) ×3 IMPLANT
WATER STERILE IRR 1000ML POUR (IV SOLUTION) IMPLANT

## 2013-11-09 NOTE — Anesthesia Procedure Notes (Signed)
Procedure Name: Intubation Date/Time: 11/09/2013 7:28 AM Performed by: Mahesh Sizemore, Jannet AskewHARLESETTA M Pre-anesthesia Checklist: Patient identified, Emergency Drugs available, Timeout performed, Patient being monitored and Suction available Patient Re-evaluated:Patient Re-evaluated prior to inductionOxygen Delivery Method: Circle system utilized Preoxygenation: Pre-oxygenation with 100% oxygen Intubation Type: IV induction Ventilation: Mask ventilation without difficulty Laryngoscope Size: Mac and 3 Grade View: Grade II Tube size: 7.0 mm Number of attempts: 2 Placement Confirmation: ETT inserted through vocal cords under direct vision,  breath sounds checked- equal and bilateral and positive ETCO2 Secured at: 22 cm Tube secured with: at lips. Dental Injury: Teeth and Oropharynx as per pre-operative assessment

## 2013-11-09 NOTE — Transfer of Care (Signed)
Immediate Anesthesia Transfer of Care Note  Patient: Jennifer Parks  Procedure(s) Performed: Procedure(s): LAPAROSCOPY OPERATIVE WITH FULGERATION OF ENDOMETRIOSIS (N/A)  Patient Location: PACU  Anesthesia Type:General  Level of Consciousness: awake, alert  and oriented  Airway & Oxygen Therapy: Patient Spontanous Breathing and Patient connected to nasal cannula oxygen  Post-op Assessment: Report given to PACU RN and Post -op Vital signs reviewed and stable  Post vital signs: Reviewed and stable  Complications: No apparent anesthesia complications

## 2013-11-09 NOTE — Anesthesia Preprocedure Evaluation (Signed)
Anesthesia Evaluation  Patient identified by MRN, date of birth, ID band Patient awake    Reviewed: Allergy & Precautions, H&P , Patient's Chart, lab work & pertinent test results, reviewed documented beta blocker date and time   History of Anesthesia Complications Negative for: history of anesthetic complications  Airway Mallampati: II TM Distance: >3 FB Neck ROM: full    Dental   Pulmonary  breath sounds clear to auscultation        Cardiovascular Exercise Tolerance: Good Rhythm:regular Rate:Normal     Neuro/Psych  Headaches, PSYCHIATRIC DISORDERS    GI/Hepatic   Endo/Other    Renal/GU      Musculoskeletal   Abdominal   Peds  Hematology   Anesthesia Other Findings   Reproductive/Obstetrics                           Anesthesia Physical Anesthesia Plan  ASA: II  Anesthesia Plan: General ETT   Post-op Pain Management:    Induction:   Airway Management Planned:   Additional Equipment:   Intra-op Plan:   Post-operative Plan:   Informed Consent: I have reviewed the patients History and Physical, chart, labs and discussed the procedure including the risks, benefits and alternatives for the proposed anesthesia with the patient or authorized representative who has indicated his/her understanding and acceptance.   Dental Advisory Given  Plan Discussed with: CRNA and Surgeon  Anesthesia Plan Comments:         Anesthesia Quick Evaluation

## 2013-11-09 NOTE — Anesthesia Postprocedure Evaluation (Signed)
  Anesthesia Post-op Note  Patient: Jennifer Parks  Procedure(s) Performed: Procedure(s): LAPAROSCOPY OPERATIVE WITH FULGERATION OF ENDOMETRIOSIS (N/A)  Patient Location: PACU  Anesthesia Type:General  Level of Consciousness: awake, alert  and oriented  Airway and Oxygen Therapy: Patient Spontanous Breathing  Post-op Pain: mild  Post-op Assessment: Post-op Vital signs reviewed, Patient's Cardiovascular Status Stable, Respiratory Function Stable, Patent Airway, No signs of Nausea or vomiting and Pain level controlled  Post-op Vital Signs: Reviewed and stable  Complications: No apparent anesthesia complications

## 2013-11-09 NOTE — Discharge Instructions (Signed)
DO NOT TAKE IBUPROFEN (ADVIL, ALEVE, OR MOTRIN) TILL AFTER 2 PM!  FU office 2-3 weeks for postop appointment.  Call the office [phone] for an appointment.  Personal Hygiene: Use pads not tampons x 1week You may shower, no tub baths or pools for 2-3 weeks Wipe from front to back when using restroom  Activity: Do not drive or operate any equipment for 24 hrs.   Do not rest in bed all day Walking is encouraged Walk up and down stairs slowly You may return to your normal activity in 1-2 days  Sexual Activity:  No intercourse for 2 weeks after the procedure.  Diet: Eat a light meal as desired this evening.  You may resume your usual diet tomorrow.  Return to work:  You may resume your work activities after 1-2 days  What to expect:  Expect to have vaginal bleeding/discharge for 2-3 days and spotting for 10-14 days.  It is not unusual to have soreness for 1-2 weeks.  You may have a slight burning sensation when you urinate for the first few days.  You may start your menses in 2-6 weeks.  Mild cramps may continue for a couple of days.    Call your doctor:   Excessive bleeding, saturating a pad every hour Inability to urinate 6 hours after discharge Pain not relieved with pain medications Fever of 100.4 or greater   Diagnostic Laparoscopy  Laparoscopy is a surgical procedure. It is used to diagnose and treat diseases inside the belly (abdomen). It is usually a brief, common, and relatively simple procedure. The laparoscopeis a thin, lighted, pencil-sized instrument. It is like a telescope. It is inserted into your abdomen through a small cut (incision). Your caregiver can look at the organs inside your body through this instrument. He or she can see if there is anything abnormal.  Laparoscopy can be done either in a hospital or outpatient clinic. You may be given a mild sedative to help you relax before the procedure. Once in the operating room, you will be given a drug to make you sleep  (general anesthesia). Laparoscopy usually lasts less than 1 hour. After the procedure, you will be monitored in a recovery area until you are stable and doing well. Once you are home, it will take 2 to 3 days to fully recover.  RISKS AND COMPLICATIONS  Laparoscopy has relatively few risks. Your caregiver will discuss the risks with you before the procedure.  Some problems that can occur include:  Infection.  Bleeding.  Damage to other organs.  Anesthetic side effects. PROCEDURE  Once you receive anesthesia, your surgeon inflates the abdomen with a harmless gas (carbon dioxide). This makes the organs easier to see. The laparoscope is inserted into the abdomen through a small incision. This allows your surgeon to see into the abdomen. Other small instruments are also inserted into the abdomen through other small openings. Many surgeons attach a video camera to the laparoscope to enlarge the view.  During a diagnostic laparoscopy, the surgeon may be looking for inflammation, infection, or cancer. Your surgeon may take tissue samples(biopsies). The samples are sent to a specialist in looking at cells and tissue samples (pathologist). The pathologist examines them under a microscope. Biopsies can help to diagnose or confirm a disease.  AFTER THE PROCEDURE  The gas is released from inside the abdomen.  The incisions are closed with stitches (sutures). Because these incisions are small (usually less than 1/2 inch), there is usually minimal discomfort after the procedure.  There may be some mild discomfort in the throat. This is from the tube placed in the throat while you were sleeping. You may have some mild abdominal discomfort. There may also be discomfort from the instrument placement incisions in the abdomen.  The recovery time is shortened as long as there are no complications.  You will rest in a recovery room until stable and doing well. As long as there are no complications, you may be allowed to go  home. FINDING OUT THE RESULTS OF YOUR TEST  Not all test results are available during your visit. If your test results are not back during the visit, make an appointment with your caregiver to find out the results. Do not assume everything is normal if you have not heard from your caregiver or the medical facility. It is important for you to follow up on all of your test results.  HOME CARE INSTRUCTIONS  Take all medicines as directed.  Only take over-the-counter or prescription medicines for pain, discomfort, or fever as directed by your caregiver.  Resume daily activities as directed.  Showers are preferred over baths.  You may resume sexual activities in 1 week or as directed.  Do not drive while taking narcotics. SEEK MEDICAL CARE IF:  There is increasing abdominal pain.  There is new pain in the shoulders (shoulder strap areas).  You feel lightheaded or faint.  You have the chills.  You or your child has an oral temperature above 102 F (38.9 C).  There is pus-like (purulent) drainage from any of the wounds.  You are unable to pass gas or have a bowel movement.  You feel sick to your stomach (nauseous) or throw up (vomit). MAKE SURE YOU:  Understand these instructions.  Will watch your condition.  Will get help right away if you are not doing well or get worse.

## 2013-11-09 NOTE — Op Note (Signed)
NAMJacklynn Parks:  Burress, Leiana              ACCOUNT NO.:  [id number]  MEDICAL RECORD NO.:  [id number]  LOCATION:  [facility]                          FACILITY:  [facility]  PHYSICIAN:  Zelphia CairoGretchen Angi Goodell, MD    DATE OF BIRTH:  Aug 19, 1990  DATE OF PROCEDURE: DATE OF DISCHARGE:                              OPERATIVE REPORT   PREOPERATIVE DIAGNOSES: 1. Chronic pelvic pain. 2. Dysmenorrhea.  POSTOPERATIVE DIAGNOSES: 1. Dysmenorrhea. 2. Pelvic pain. 3. Endometriosis.  PROCEDURE:  Diagnostic laparoscopy with fulguration of endometriosis.  SURGEON:  Zelphia CairoGretchen Ravon Mcilhenny, M.D.  ANESTHESIA:  General.  COMPLICATIONS:  None.  SPECIMEN:  None.  CONDITION:  Stable to recovery room.  DESCRIPTION OF PROCEDURE:  The patient was taken to the operating room after informed consent was obtained.  She was given general anesthesia, placed in the dorsal lithotomy position using Allen stirrups.  She was prepped and draped in sterile fashion.  An in-and-out catheter was used to drain her bladder.  Bivalve speculum was placed in the vagina, and a single-tooth tenaculum was attached to the anterior lip of the cervix. The Hulka clamp was placed.  Tenaculum and speculum were removed, and our attention was turned to the abdomen.  A 0.25% Marcaine was used to provide local anesthesia at the site of our infraumbilical and suprapubic incision.  Incision was made in the belly button with a scalpel and the optical trocar was inserted under direct visualization.  Once intraperitoneal placement was confirmed, CO2 was turned on and a survey of the abdomen and pelvis were performed. Uterus, bilateral fallopian tubes, and ovaries appeared normal.  She did have a significant increase in vascularity over the anterior uterus what appeared to be some endometriosis in the anterior cul-de-sac.  She had multiple implants of endometriosis throughout the pelvic side walls, specifically in the left ovarian fossa and posterior cul-de-sac.   Right upper quadrant appeared normal.  Appendix appeared normal.  A small suprapubic incision was made and a 5-mm trocar was inserted under direct visualization.  Blunt probe was used to manipulate the uterus and adnexa.  Bipolar was placed through the operative scope and implants of endometriosis were fulgurated.  After this was performed, all instruments and trocars were removed from the abdomen.  CO2 was released.  A deep stitch was placed in the infraumbilical incision and the skin was closed with Vicryl.  Dermabond was placed over the incision.  Hulka clamp was removed.  The patient was extubated and taken to the recovery room in stable condition.  Sponge, lap, needle, and instrument counts were correct x2.     Zelphia CairoGretchen [name], MD     GA/MEDQ  D:  11/09/2013  T:  11/09/2013  Job:  [id number]

## 2013-11-10 ENCOUNTER — Encounter (HOSPITAL_COMMUNITY): Payer: Self-pay | Admitting: Obstetrics and Gynecology

## 2013-11-11 ENCOUNTER — Inpatient Hospital Stay (HOSPITAL_COMMUNITY): Payer: 59

## 2013-11-11 ENCOUNTER — Encounter (HOSPITAL_COMMUNITY): Payer: Self-pay | Admitting: *Deleted

## 2013-11-11 ENCOUNTER — Encounter (HOSPITAL_COMMUNITY)
Admission: AD | Disposition: A | Discharge status: Home / Self Care | Payer: Self-pay | Source: Ambulatory Visit | Attending: Obstetrics and Gynecology

## 2013-11-11 ENCOUNTER — Inpatient Hospital Stay (HOSPITAL_COMMUNITY)
Admission: AD | Admit: 2013-11-11 | Discharge: 2013-11-13 | DRG: 908 | Disposition: A | Discharge status: Home / Self Care | Payer: 59 | Source: Ambulatory Visit | Attending: Obstetrics and Gynecology | Admitting: Obstetrics and Gynecology

## 2013-11-11 ENCOUNTER — Encounter (HOSPITAL_COMMUNITY): Payer: 59 | Admitting: Anesthesiology

## 2013-11-11 ENCOUNTER — Inpatient Hospital Stay (HOSPITAL_COMMUNITY): Payer: 59 | Admitting: Anesthesiology

## 2013-11-11 DIAGNOSIS — R109 Unspecified abdominal pain: Secondary | ICD-10-CM | POA: Diagnosis present

## 2013-11-11 DIAGNOSIS — R188 Other ascites: Secondary | ICD-10-CM | POA: Diagnosis present

## 2013-11-11 DIAGNOSIS — G8918 Other acute postprocedural pain: Secondary | ICD-10-CM | POA: Diagnosis present

## 2013-11-11 DIAGNOSIS — N289 Disorder of kidney and ureter, unspecified: Secondary | ICD-10-CM | POA: Diagnosis present

## 2013-11-11 DIAGNOSIS — F411 Generalized anxiety disorder: Secondary | ICD-10-CM | POA: Diagnosis present

## 2013-11-11 DIAGNOSIS — S3730XA Unspecified injury of urethra, initial encounter: Secondary | ICD-10-CM

## 2013-11-11 DIAGNOSIS — IMO0002 Reserved for concepts with insufficient information to code with codable children: Principal | ICD-10-CM | POA: Diagnosis present

## 2013-11-11 DIAGNOSIS — Y9210 Unspecified residential institution as the place of occurrence of the external cause: Secondary | ICD-10-CM | POA: Diagnosis present

## 2013-11-11 DIAGNOSIS — K5900 Constipation, unspecified: Secondary | ICD-10-CM | POA: Diagnosis present

## 2013-11-11 DIAGNOSIS — S3720XA Unspecified injury of bladder, initial encounter: Secondary | ICD-10-CM

## 2013-11-11 DIAGNOSIS — J9819 Other pulmonary collapse: Secondary | ICD-10-CM | POA: Diagnosis present

## 2013-11-11 DIAGNOSIS — N809 Endometriosis, unspecified: Secondary | ICD-10-CM | POA: Diagnosis present

## 2013-11-11 DIAGNOSIS — F3289 Other specified depressive episodes: Secondary | ICD-10-CM | POA: Diagnosis present

## 2013-11-11 DIAGNOSIS — D72829 Elevated white blood cell count, unspecified: Secondary | ICD-10-CM | POA: Diagnosis present

## 2013-11-11 DIAGNOSIS — F329 Major depressive disorder, single episode, unspecified: Secondary | ICD-10-CM | POA: Diagnosis present

## 2013-11-11 DIAGNOSIS — J90 Pleural effusion, not elsewhere classified: Secondary | ICD-10-CM | POA: Diagnosis present

## 2013-11-11 HISTORY — PX: LAPAROTOMY: SHX154

## 2013-11-11 HISTORY — PX: CYSTOSCOPY: SHX5120

## 2013-11-11 LAB — CBC
HCT: 42.8 % (ref 36.0–46.0)
Hemoglobin: 15 g/dL (ref 12.0–15.0)
MCH: 30.3 pg (ref 26.0–34.0)
MCHC: 35 g/dL (ref 30.0–36.0)
MCV: 86.5 fL (ref 78.0–100.0)
Platelets: 369 10*3/uL (ref 150–400)
RBC: 4.95 MIL/uL (ref 3.87–5.11)
RDW: 12.9 % (ref 11.5–15.5)
WBC: 24.8 10*3/uL — ABNORMAL HIGH (ref 4.0–10.5)

## 2013-11-11 LAB — URINALYSIS, ROUTINE W REFLEX MICROSCOPIC
Bilirubin Urine: NEGATIVE
Glucose, UA: NEGATIVE mg/dL
Ketones, ur: NEGATIVE mg/dL
Leukocytes, UA: NEGATIVE
NITRITE: NEGATIVE
PROTEIN: NEGATIVE mg/dL
Specific Gravity, Urine: 1.01 (ref 1.005–1.030)
UROBILINOGEN UA: 0.2 mg/dL (ref 0.0–1.0)
pH: 5 (ref 5.0–8.0)

## 2013-11-11 LAB — COMPREHENSIVE METABOLIC PANEL
ALT: 12 U/L (ref 0–35)
AST: 16 U/L (ref 0–37)
Albumin: 3 g/dL — ABNORMAL LOW (ref 3.5–5.2)
Alkaline Phosphatase: 74 U/L (ref 39–117)
BUN: 53 mg/dL — ABNORMAL HIGH (ref 6–23)
CO2: 18 mEq/L — ABNORMAL LOW (ref 19–32)
Calcium: 9.3 mg/dL (ref 8.4–10.5)
Chloride: 97 mEq/L (ref 96–112)
Creatinine, Ser: 5.71 mg/dL — ABNORMAL HIGH (ref 0.50–1.10)
GFR calc Af Amer: 11 mL/min — ABNORMAL LOW (ref >90)
GFR calc non Af Amer: 10 mL/min — ABNORMAL LOW (ref >90)
Glucose, Bld: 100 mg/dL — ABNORMAL HIGH (ref 70–99)
Potassium: 5.1 mEq/L (ref 3.7–5.3)
Sodium: 129 mEq/L — ABNORMAL LOW (ref 137–147)
Total Bilirubin: 0.5 mg/dL (ref 0.3–1.2)
Total Protein: 6.3 g/dL (ref 6.0–8.3)

## 2013-11-11 LAB — URINE MICROSCOPIC-ADD ON

## 2013-11-11 LAB — MRSA PCR SCREENING: MRSA by PCR: NEGATIVE

## 2013-11-11 SURGERY — CYSTOSCOPY
Anesthesia: General | Site: Bladder

## 2013-11-11 MED ORDER — IOHEXOL 300 MG/ML  SOLN
50.0000 mL | INTRAMUSCULAR | Status: AC
  Administered 2013-11-11 (×2): 50 mL via ORAL

## 2013-11-11 MED ORDER — CIPROFLOXACIN IN D5W 400 MG/200ML IV SOLN
400.0000 mg | Freq: Once | INTRAVENOUS | Status: AC
  Administered 2013-11-11: 400 mg via INTRAVENOUS
  Filled 2013-11-11: qty 200

## 2013-11-11 MED ORDER — PROPOFOL 10 MG/ML IV EMUL
INTRAVENOUS | Status: AC
  Filled 2013-11-11: qty 20

## 2013-11-11 MED ORDER — LIDOCAINE HCL (CARDIAC) 20 MG/ML IV SOLN
INTRAVENOUS | Status: DC | PRN
  Administered 2013-11-11: 50 mg via INTRAVENOUS

## 2013-11-11 MED ORDER — IOHEXOL 300 MG/ML  SOLN
100.0000 mL | Freq: Once | INTRAMUSCULAR | Status: AC | PRN
  Administered 2013-11-11: 100 mL via INTRAVENOUS

## 2013-11-11 MED ORDER — ONDANSETRON HCL 4 MG PO TABS: 4.0000 mg | ORAL_TABLET | Freq: Four times a day (QID) | ORAL | Status: DC | PRN

## 2013-11-11 MED ORDER — FENTANYL CITRATE 0.05 MG/ML IJ SOLN
INTRAMUSCULAR | Status: AC
  Filled 2013-11-11: qty 5

## 2013-11-11 MED ORDER — BUPIVACAINE HCL (PF) 0.25 % IJ SOLN
INTRAMUSCULAR | Status: AC
Start: 2013-11-11 — End: 2013-11-11
  Filled 2013-11-11: qty 30

## 2013-11-11 MED ORDER — BUTORPHANOL TARTRATE 1 MG/ML IJ SOLN
1.0000 mg | INTRAMUSCULAR | Status: DC | PRN
  Administered 2013-11-11 (×2): 1 mg via INTRAVENOUS
  Filled 2013-11-11 (×2): qty 1

## 2013-11-11 MED ORDER — NEOSTIGMINE METHYLSULFATE 1 MG/ML IJ SOLN
INTRAMUSCULAR | Status: AC
  Filled 2013-11-11: qty 1

## 2013-11-11 MED ORDER — CISATRACURIUM BESYLATE (PF) 10 MG/5ML IV SOLN
INTRAVENOUS | Status: DC | PRN
  Administered 2013-11-11: 6 mg via INTRAVENOUS
  Administered 2013-11-11: 2 mg via INTRAVENOUS

## 2013-11-11 MED ORDER — FENTANYL CITRATE 0.05 MG/ML IJ SOLN
INTRAMUSCULAR | Status: AC
  Filled 2013-11-11: qty 2

## 2013-11-11 MED ORDER — MIDAZOLAM HCL 2 MG/2ML IJ SOLN
INTRAMUSCULAR | Status: AC
  Filled 2013-11-11: qty 2

## 2013-11-11 MED ORDER — MENTHOL 3 MG MT LOZG: 1.0000 | LOZENGE | OROMUCOSAL | Status: DC | PRN

## 2013-11-11 MED ORDER — IOHEXOL 300 MG/ML  SOLN
INTRAMUSCULAR | Status: DC | PRN
  Administered 2013-11-11: 4 mL via INTRAVENOUS

## 2013-11-11 MED ORDER — SODIUM CHLORIDE 0.9 % IV SOLN: INTRAVENOUS | Status: DC

## 2013-11-11 MED ORDER — DEXTROSE IN LACTATED RINGERS 5 % IV SOLN
INTRAVENOUS | Status: DC
  Administered 2013-11-11: 19:00:00 via INTRAVENOUS

## 2013-11-11 MED ORDER — FENTANYL CITRATE 0.05 MG/ML IJ SOLN
INTRAMUSCULAR | Status: DC | PRN
  Administered 2013-11-11: 50 ug via INTRAVENOUS
  Administered 2013-11-11: 100 ug via INTRAVENOUS
  Administered 2013-11-11: 150 ug via INTRAVENOUS
  Administered 2013-11-11 – 2013-11-12 (×4): 50 ug via INTRAVENOUS

## 2013-11-11 MED ORDER — GLYCOPYRROLATE 0.2 MG/ML IJ SOLN
INTRAMUSCULAR | Status: AC
  Filled 2013-11-11: qty 3

## 2013-11-11 MED ORDER — PROPOFOL 10 MG/ML IV BOLUS
INTRAVENOUS | Status: DC | PRN
  Administered 2013-11-11: 150 mg via INTRAVENOUS
  Administered 2013-11-12: 50 mg via INTRAVENOUS

## 2013-11-11 MED ORDER — PROMETHAZINE HCL 25 MG/ML IJ SOLN
12.5000 mg | Freq: Once | INTRAMUSCULAR | Status: AC
  Administered 2013-11-11: 12.5 mg via INTRAVENOUS
  Filled 2013-11-11: qty 1

## 2013-11-11 MED ORDER — LACTATED RINGERS IV SOLN
INTRAVENOUS | Status: DC
  Administered 2013-11-11 – 2013-11-12 (×5): via INTRAVENOUS

## 2013-11-11 MED ORDER — LIDOCAINE HCL (CARDIAC) 20 MG/ML IV SOLN
INTRAVENOUS | Status: AC
Start: 2013-11-11 — End: 2013-11-11
  Filled 2013-11-11: qty 5

## 2013-11-11 MED ORDER — ONDANSETRON HCL 4 MG/2ML IJ SOLN: 4.0000 mg | Freq: Four times a day (QID) | INTRAMUSCULAR | Status: DC | PRN

## 2013-11-11 MED ORDER — MIDAZOLAM HCL 2 MG/2ML IJ SOLN
INTRAMUSCULAR | Status: DC | PRN
  Administered 2013-11-11: 2 mg via INTRAVENOUS

## 2013-11-11 MED ORDER — OXYCODONE-ACETAMINOPHEN 5-325 MG PO TABS
1.0000 | ORAL_TABLET | ORAL | Status: DC | PRN
  Administered 2013-11-12 – 2013-11-13 (×3): 1 via ORAL
  Filled 2013-11-11 (×3): qty 1

## 2013-11-11 MED ORDER — ONDANSETRON HCL 4 MG/2ML IJ SOLN
INTRAMUSCULAR | Status: AC
  Filled 2013-11-11: qty 2

## 2013-11-11 SURGICAL SUPPLY — 47 items
ADH SKN CLS APL DERMABOND .7 (GAUZE/BANDAGES/DRESSINGS) ×2
BAG SPEC RTRVL LRG 6X4 10 (ENDOMECHANICALS)
BAG URINE DRAINAGE (UROLOGICAL SUPPLIES) ×4 IMPLANT
BARRIER ADHS 3X4 INTERCEED (GAUZE/BANDAGES/DRESSINGS) IMPLANT
BRR ADH 4X3 ABS CNTRL BYND (GAUZE/BANDAGES/DRESSINGS)
CABLE HIGH FREQUENCY MONO STRZ (ELECTRODE) IMPLANT
CATH FOLEY 2WAY SLVR  5CC 18FR (CATHETERS) ×2
CATH FOLEY 2WAY SLVR 5CC 18FR (CATHETERS) ×2 IMPLANT
CATH ROBINSON RED A/P 16FR (CATHETERS) IMPLANT
CATH URET 5FR 28IN OPEN ENDED (CATHETERS) ×4 IMPLANT
CHLORAPREP W/TINT 26ML (MISCELLANEOUS) ×4 IMPLANT
CLOSURE WOUND 1/2 X4 (GAUZE/BANDAGES/DRESSINGS)
CLOTH BEACON ORANGE TIMEOUT ST (SAFETY) ×4 IMPLANT
DERMABOND ADVANCED (GAUZE/BANDAGES/DRESSINGS) ×2
DERMABOND ADVANCED .7 DNX12 (GAUZE/BANDAGES/DRESSINGS) ×2 IMPLANT
DRAIN JACKSON PRT FLT 10 (DRAIN) ×4 IMPLANT
DRAPE C-ARM 42X72 X-RAY (DRAPES) ×8 IMPLANT
EVACUATOR SILICONE 100CC (DRAIN) ×4 IMPLANT
GLOVE BIO SURGEON STRL SZ 6.5 (GLOVE) ×3 IMPLANT
GLOVE BIO SURGEON STRL SZ7.5 (GLOVE) ×8 IMPLANT
GLOVE BIO SURGEONS STRL SZ 6.5 (GLOVE) ×1
GLOVE BIOGEL PI IND STRL 7.0 (GLOVE) ×2 IMPLANT
GLOVE BIOGEL PI INDICATOR 7.0 (GLOVE) ×2
GLOVE SURG SS PI 7.5 STRL IVOR (GLOVE) ×8 IMPLANT
GOWN STRL REUS W/TWL LRG LVL3 (GOWN DISPOSABLE) ×24 IMPLANT
NS IRRIG 1000ML POUR BTL (IV SOLUTION) ×4 IMPLANT
PACK LAPAROSCOPY BASIN (CUSTOM PROCEDURE TRAY) IMPLANT
PACK LAVH (CUSTOM PROCEDURE TRAY) ×4 IMPLANT
PLUG CATH AND CAP STER (CATHETERS) ×4 IMPLANT
POUCH SPECIMEN RETRIEVAL 10MM (ENDOMECHANICALS) IMPLANT
PROTECTOR NERVE ULNAR (MISCELLANEOUS) IMPLANT
SEALER TISSUE G2 CVD JAW 35 (ENDOMECHANICALS) IMPLANT
SEALER TISSUE G2 CVD JAW 45CM (ENDOMECHANICALS) IMPLANT
SET IRRIG TUBING LAPAROSCOPIC (IRRIGATION / IRRIGATOR) IMPLANT
SPONGE GAUZE 4X4 12PLY (GAUZE/BANDAGES/DRESSINGS) ×4 IMPLANT
STRIP CLOSURE SKIN 1/2X4 (GAUZE/BANDAGES/DRESSINGS) IMPLANT
SUT VIC AB 2-0 UR6 27 (SUTURE) ×4 IMPLANT
SUT VIC AB 3-0 PS2 18 (SUTURE) ×4
SUT VIC AB 3-0 PS2 18XBRD (SUTURE) ×2 IMPLANT
SUT VICRYL 0 UR6 27IN ABS (SUTURE) ×8 IMPLANT
TOWEL OR 17X24 6PK STRL BLUE (TOWEL DISPOSABLE) ×16 IMPLANT
TROCAR OPTI TIP 5M 100M (ENDOMECHANICALS) ×4 IMPLANT
TROCAR XCEL NON-BLD 11X100MML (ENDOMECHANICALS) ×4 IMPLANT
TROCAR XCEL OPT SLVE 5M 100M (ENDOMECHANICALS) IMPLANT
VICRYL J327  3-0 CT-3 ×28 IMPLANT
WARMER LAPAROSCOPE (MISCELLANEOUS) IMPLANT
WATER STERILE IRR 1000ML POUR (IV SOLUTION) ×4 IMPLANT

## 2013-11-11 NOTE — MAU Note (Signed)
Report called to Salena SanerJulie Potts, [facility] and patient assigned to Room 318.

## 2013-11-11 NOTE — MAU Note (Signed)
Patient presents with complaint of abdominal pain and inability to void since laparoscopy surgery on Tuesday.

## 2013-11-11 NOTE — MAU Provider Note (Signed)
Chief Complaint: Post-op Problem   First Provider Initiated Contact with Patient 11/11/13 1041     SUBJECTIVE HPI: Jennifer Parks is a 23 y.o. who presents to maternity admissions on Day 2 postop following laproscopic surgery for endometriosis with abdominal pain since surgery, unresolved with Percocet.  She also reports only small amounts of urine x2 episodes since her surgery 48 hours ago.  She denies vaginal bleeding, h/a, dizziness, n/v, or fever/chills.     Past Medical History  Diagnosis Date  . Migraines   . Acne   . Depression     no meds  . Anxiety     no meds   Past Surgical History  Procedure Laterality Date  . Wisdom tooth extraction    . Laparoscopy N/A 11/09/2013    Procedure: LAPAROSCOPY OPERATIVE WITH FULGERATION OF ENDOMETRIOSIS;  Surgeon: Zelphia CairoGretchen Adkins, MD;  Location: [facility];  Service: Gynecology;  Laterality: N/A;   History   Social History  . Marital Status: Single    Spouse Name: N/A    Number of Children: N/A  . Years of Education: N/A   Occupational History  . Not on file.   Social History Main Topics  . Smoking status: Never Smoker   . Smokeless tobacco: Never Used     Comment: single - student at [employer]  . Alcohol Use: Yes     Comment: social  . Drug Use: No  . Sexual Activity: Yes    Birth Control/ Protection: Pill   Other Topics Concern  . Not on file   Social History Narrative  . No narrative on file   No current facility-administered medications on file prior to encounter.   Current Outpatient Prescriptions on File Prior to Encounter  Medication Sig Dispense Refill  . ibuprofen (ADVIL,MOTRIN) 200 MG tablet Take 400 mg by mouth every 6 (six) hours as needed for moderate pain.      . metroNIDAZOLE (METROGEL) 1 % gel Apply 1 application topically daily.      . Multiple Vitamin (MULTIVITAMIN WITH MINERALS) TABS tablet Take 1 tablet by mouth daily.      . norethindrone-ethinyl estradiol (JUNEL FE,GILDESS FE,LOESTRIN FE) 1-20 MG-MCG  tablet Take 1 tablet by mouth daily.      Marland Kitchen. oxyCODONE-acetaminophen (PERCOCET/ROXICET) 5-325 MG per tablet Take 1-2 tablets by mouth every 4 (four) hours as needed for severe pain.  30 tablet  0  . topiramate (TOPAMAX) 50 MG tablet Take 50 mg by mouth daily.       Allergies  Allergen Reactions  . Amoxicillin Anaphylaxis and Rash    ROS: Pertinent items in HPI  OBJECTIVE Blood pressure 110/69, pulse 89, temperature 98.1 F (36.7 C), temperature source Oral, resp. rate 18, height 5' 4.5" (1.638 m), weight 71.838 kg (158 lb 6 oz), last menstrual period 10/23/2013. GENERAL: Well-developed, well-nourished female in no acute distress.  HEENT: Normocephalic HEART: normal rate RESP: normal effort ABDOMEN: Soft, tender in R and L lower quadrants and in suprapubic area, no rebound tenderness.  Abdomen mildly distended, bowel sounds in all 4 quadrants, with skin taut and discomfort with superficial palpation of skin EXTREMITIES: Nontender, no edema NEURO: Alert and oriented SPECULUM EXAM: Deferred  LAB RESULTS Results for orders placed during the hospital encounter of 11/11/13 (from the past 24 hour(s))  URINALYSIS, ROUTINE W REFLEX MICROSCOPIC     Status: Abnormal   Collection Time    11/11/13  9:50 AM      Result Value Ref Range   Color, Urine  YELLOW  YELLOW   APPearance CLEAR  CLEAR   Specific Gravity, Urine 1.010  1.005 - 1.030   pH 5.0  5.0 - 8.0   Glucose, UA NEGATIVE  NEGATIVE mg/dL   Hgb urine dipstick TRACE (*) NEGATIVE   Bilirubin Urine NEGATIVE  NEGATIVE   Ketones, ur NEGATIVE  NEGATIVE mg/dL   Protein, ur NEGATIVE  NEGATIVE mg/dL   Urobilinogen, UA 0.2  0.0 - 1.0 mg/dL   Nitrite NEGATIVE  NEGATIVE   Leukocytes, UA NEGATIVE  NEGATIVE  URINE MICROSCOPIC-ADD ON     Status: None   Collection Time    11/11/13  9:50 AM      Result Value Ref Range   Squamous Epithelial / LPF RARE  RARE   WBC, UA 0-2  <3 WBC/hpf   Consult Dr Marcelle Overlie Dr Marcelle Overlie to bedside to see pt     Medication List    ASK your doctor about these medications       ibuprofen 200 MG tablet  Commonly known as:  ADVIL,MOTRIN  Take 400 mg by mouth every 6 (six) hours as needed for moderate pain.     metroNIDAZOLE 1 % gel  Commonly known as:  METROGEL  Apply 1 application topically daily.     multivitamin with minerals Tabs tablet  Take 1 tablet by mouth daily.     norethindrone-ethinyl estradiol 1-20 MG-MCG tablet  Commonly known as:  JUNEL FE,GILDESS FE,LOESTRIN FE  Take 1 tablet by mouth daily.     oxyCODONE-acetaminophen 5-325 MG per tablet  Commonly known as:  PERCOCET/ROXICET  Take 1-2 tablets by mouth every 4 (four) hours as needed for severe pain.     simethicone 125 MG chewable tablet  Commonly known as:  MYLICON  Chew 250 mg by mouth every 6 (six) hours as needed for flatulence.     topiramate 50 MG tablet  Commonly known as:  TOPAMAX  Take 50 mg by mouth daily.         Sharen Counter Certified Nurse-Midwife 11/11/2013  11:02 AM

## 2013-11-11 NOTE — Progress Notes (Signed)
Discussed VCUG and CT results with Dr Manson PasseyBrown.  Both c/w bladder injury.  Dr Brunilda PayorNesi consulted and agreed to evaluate patient.  Foley catheter in place.  NPO.  Will consider IV antibiotics based on management plan.

## 2013-11-11 NOTE — Anesthesia Preprocedure Evaluation (Signed)
Anesthesia Evaluation  Patient identified by MRN, date of birth, ID band Patient awake    Reviewed: Allergy & Precautions, H&P , NPO status , Patient's Chart, lab work & pertinent test results, reviewed documented beta blocker date and time   History of Anesthesia Complications Negative for: history of anesthetic complications  Airway Mallampati: II TM Distance: >3 FB Neck ROM: full    Dental  (+) Teeth Intact   Pulmonary neg pulmonary ROS,  breath sounds clear to auscultation  Pulmonary exam normal       Cardiovascular negative cardio ROS  Rhythm:regular Rate:Normal     Neuro/Psych  Headaches, PSYCHIATRIC DISORDERS Anxiety Depression    GI/Hepatic negative GI ROS, Neg liver ROS,   Endo/Other  negative endocrine ROS  Renal/GU ARFRenal disease (Cr 5.7, BUN 53, K 5.1) Bladder dysfunction (suspected bladder injury) Female GU complaint (laparoscopy 2 days ago for endometriosis)     Musculoskeletal   Abdominal   Peds  Hematology negative hematology ROS (+)   Anesthesia Other Findings NPO since 10 am  Reproductive/Obstetrics negative OB ROS                           Anesthesia Physical Anesthesia Plan  ASA: II and emergent  Anesthesia Plan: General ETT   Post-op Pain Management:    Induction: Intravenous  Airway Management Planned: Oral ETT  Additional Equipment:   Intra-op Plan:   Post-operative Plan: Extubation in OR  Informed Consent: I have reviewed the patients History and Physical, chart, labs and discussed the procedure including the risks, benefits and alternatives for the proposed anesthesia with the patient or authorized representative who has indicated his/her understanding and acceptance.   Dental Advisory Given  Plan Discussed with: CRNA and Surgeon  Anesthesia Plan Comments: (ARF - avoid succ.  Will use cisatracurium)        Anesthesia Quick Evaluation

## 2013-11-11 NOTE — H&P (Addendum)
23 yo WF 2 days s/p laparoscopy with fulgeration of endometriosis presents for evaluation of abdominal pain unrelieved with percocet and inability to void.  Pt reports only able to empty her bladder small amounts since surgery.  Denies fevers.  + abdominal bloating.  No vaginal bleeding, lightheadedness or dizziness  PMHx;  Migraines PSHx: wisdom teeth, laparoscopy All:  Amoxicillin, PCN Meds: OCP, topamax, zomig prn, MTV, percocet and ibuprofen FHx:  Lung ca, bladder ca SHx:  Negative for tobacco  AF, VSS Gen - NAD CV - RRR Lungs - clear Abd - distended and tender in bilateral LQ and suprapubic tenderness  Labs - WBC 24.8, Hgb 15, Cr 5.71 CT - free fluid in abdomen, retroperitoneal fluid density measuring 3cm.    A/P:  Post operative pain with free fluid in abdomen with elevated WBC and Cr.  Concerned about bladder injury.  Pt currently getting VCUG and urology called for consult.  Plan to admit for further workup and management.

## 2013-11-11 NOTE — Consult Note (Signed)
Urology Consult  Referring physician: Dr. Marylynn Pearson Reason for referral: Possible bladder injury  Chief Complaint: Abdominal pain, voiding small amount of urine  History of Present Illness: Patient is a 23 years old female who had diagnostic laparoscopy and fulguration of endometriosis on 11/09/2012. Since the surgery she has been able to void only small amount of urine at a time. Her abdomen became bloated.Jennifer Parks She was seen by Dr. Julien Girt this morning. Her creatinine went up to 5.71. BUN is 53. Her white count is 24.8. CT scan with contrast showed free fluid in the peritoneal cavity. A VCUG and CT pelvis showed extravasation of contrast. On the CT scan of the pelvis the density of the intra-abdominal fluid increased from 2 to 47 Hounsfield units seen on the earlier abdomen and pelvis CT scan. On examination her abdomen is distended and tender. She has a Foley catheter that is draining clear urine. She has been nauseated.  Past Medical History  Diagnosis Date  . Migraines   . Acne   . Depression     no meds  . Anxiety     no meds   Past Surgical History  Procedure Laterality Date  . Wisdom tooth extraction    . Laparoscopy N/A 11/09/2013    Procedure: LAPAROSCOPY OPERATIVE WITH FULGERATION OF ENDOMETRIOSIS;  Surgeon: Marylynn Pearson, MD;  Location: [facility];  Service: Gynecology;  Laterality: N/A;    Medications: Percocet, Zofran Allergies:  Allergies  Allergen Reactions  . Amoxicillin Anaphylaxis and Rash    Family History  Problem Relation Age of Onset  . Mental illness Mother   . Hyperlipidemia Father   . Lung cancer Maternal Grandfather   . Mental illness Paternal Grandmother   . Hyperlipidemia Paternal Grandfather   . Heart disease Paternal Grandfather    Social History:  reports that she has never smoked. She has never used smokeless tobacco. She reports that she drinks alcohol. She reports that she does not use illicit drugs.  ROS: All systems are reviewed and  negative except as noted.   Physical Exam:  Vital signs in last 24 hours: Temp:  [98.1 F (36.7 C)-98.9 F (37.2 C)] 98.5 F (36.9 C) (04/02 2100) Pulse Rate:  [89-108] 102 (04/02 2100) Resp:  [17-18] 17 (04/02 2100) BP: (110-140)/(62-84) 114/62 mmHg (04/02 2100) SpO2:  [97 %-100 %] 97 % (04/02 2100) Weight:  [71.838 kg (158 lb 6 oz)] 71.838 kg (158 lb 6 oz) (04/02 1013) HEENT: Normal Cardiovascular: Skin warm; not flushed Respiratory: Breaths quiet; no shortness of breath Abdomen: No masses. Distended and tender. She is more tender in the suprapubic area. Neurological: Normal sensation to touch Musculoskeletal: Normal motor function arms and legs Lymphatics: No inguinal adenopathy Skin: No rashes Genitourinary: She has an indwelling Foley catheter that is draining grossly clear urine  Laboratory Data:  Results for orders placed during the hospital encounter of 11/11/13 (from the past 72 hour(s))  URINALYSIS, ROUTINE W REFLEX MICROSCOPIC     Status: Abnormal   Collection Time    11/11/13  9:50 AM      Result Value Ref Range   Color, Urine YELLOW  YELLOW   APPearance CLEAR  CLEAR   Specific Gravity, Urine 1.010  1.005 - 1.030   pH 5.0  5.0 - 8.0   Glucose, UA NEGATIVE  NEGATIVE mg/dL   Hgb urine dipstick TRACE (*) NEGATIVE   Bilirubin Urine NEGATIVE  NEGATIVE   Ketones, ur NEGATIVE  NEGATIVE mg/dL   Protein, ur NEGATIVE  NEGATIVE  mg/dL   Urobilinogen, UA 0.2  0.0 - 1.0 mg/dL   Nitrite NEGATIVE  NEGATIVE   Leukocytes, UA NEGATIVE  NEGATIVE  URINE MICROSCOPIC-ADD ON     Status: None   Collection Time    11/11/13  9:50 AM      Result Value Ref Range   Squamous Epithelial / LPF RARE  RARE   WBC, UA 0-2  <3 WBC/hpf  CBC     Status: Abnormal   Collection Time    11/11/13 11:55 AM      Result Value Ref Range   WBC 24.8 (*) 4.0 - 10.5 K/uL   RBC 4.95  3.87 - 5.11 MIL/uL   Hemoglobin 15.0  12.0 - 15.0 g/dL   HCT 42.8  36.0 - 46.0 %   MCV 86.5  78.0 - 100.0 fL   MCH 30.3   26.0 - 34.0 pg   MCHC 35.0  30.0 - 36.0 g/dL   RDW 12.9  11.5 - 15.5 %   Platelets 369  150 - 400 K/uL  COMPREHENSIVE METABOLIC PANEL     Status: Abnormal   Collection Time    11/11/13  3:31 PM      Result Value Ref Range   Sodium 129 (*) 137 - 147 mEq/L   Potassium 5.1  3.7 - 5.3 mEq/L   Chloride 97  96 - 112 mEq/L   CO2 18 (*) 19 - 32 mEq/L   Glucose, Bld 100 (*) 70 - 99 mg/dL   BUN 53 (*) 6 - 23 mg/dL   Creatinine, Ser 5.71 (*) 0.50 - 1.10 mg/dL   Calcium 9.3  8.4 - 10.5 mg/dL   Total Protein 6.3  6.0 - 8.3 g/dL   Albumin 3.0 (*) 3.5 - 5.2 g/dL   AST 16  0 - 37 U/L   ALT 12  0 - 35 U/L   Alkaline Phosphatase 74  39 - 117 U/L   Total Bilirubin 0.5  0.3 - 1.2 mg/dL   GFR calc non Af Amer 10 (*) >90 mL/min   GFR calc Af Amer 11 (*) >90 mL/min   Comment: (NOTE)     The eGFR has been calculated using the CKD EPI equation.     This calculation has not been validated in all clinical situations.     eGFR's persistently <90 mL/min signify possible Chronic Kidney     Disease.   No results found for this or any previous visit (from the past 240 hour(s)). Creatinine:  Recent Labs  11/11/13 1531  CREATININE 5.71*    Xrays: See report/chart   Impression/Assessment:  Intra-and extraperitoneal bladder injury. Renal insufficiency   Plan:  She needs a cystoscopy and retrograde pyelograms to confirm the integrity of the ureters and evaluate the bladder for localization of the injury. I asked Dr. Louis Meckel to come in and help with the procedure if the laceration is intraperitoneal and can be repaired laparoscopically. If it is extraperitoneal she will probably need an open exploration of the bladder with repair of the injury. The procedure, the risks, benefits were explained to the patient and her parents. I answered all the questions to her satisfaction. They understand and are agreeable.  Hanley Ben   CC: Dr Marylynn Pearson

## 2013-11-11 NOTE — OR Nursing (Signed)
Cystoscope performed by Berniece SalinesBenjamin Herrick, MD, Mini Laparotomy performed by Berniece SalinesBenjamin Herrick, MD and Su GrandMarc Nesi, MD assisting. Case booked incorrectly under Dr. Zelphia CairoGretchen Adkins.

## 2013-11-11 NOTE — Anesthesia Procedure Notes (Signed)
Procedure Name: Intubation Date/Time: 11/11/2013 10:22 PM Performed by: Shanon PayorGREGORY, Amon Costilla M Pre-anesthesia Checklist: Patient identified, Emergency Drugs available, Suction available, Patient being monitored and Timeout performed Patient Re-evaluated:Patient Re-evaluated prior to inductionOxygen Delivery Method: Circle system utilized Preoxygenation: Pre-oxygenation with 100% oxygen Intubation Type: IV induction Ventilation: Mask ventilation without difficulty Laryngoscope Size: Mac and 3 Grade View: Grade II Tube type: Oral Tube size: 7.0 mm Number of attempts: 1 Airway Equipment and Method: Stylet Placement Confirmation: ETT inserted through vocal cords under direct vision,  positive ETCO2 and breath sounds checked- equal and bilateral Secured at: 21 cm Tube secured with: Tape Dental Injury: Teeth and Oropharynx as per pre-operative assessment

## 2013-11-12 ENCOUNTER — Inpatient Hospital Stay (HOSPITAL_COMMUNITY): Payer: 59

## 2013-11-12 DIAGNOSIS — N809 Endometriosis, unspecified: Secondary | ICD-10-CM | POA: Diagnosis present

## 2013-11-12 LAB — COMPREHENSIVE METABOLIC PANEL
ALK PHOS: 64 U/L (ref 39–117)
ALT: 8 U/L (ref 0–35)
AST: 13 U/L (ref 0–37)
Albumin: 2.3 g/dL — ABNORMAL LOW (ref 3.5–5.2)
BILIRUBIN TOTAL: 0.4 mg/dL (ref 0.3–1.2)
BUN: 25 mg/dL — ABNORMAL HIGH (ref 6–23)
CHLORIDE: 104 meq/L (ref 96–112)
CO2: 22 meq/L (ref 19–32)
Calcium: 8.4 mg/dL (ref 8.4–10.5)
Creatinine, Ser: 1.83 mg/dL — ABNORMAL HIGH (ref 0.50–1.10)
GFR calc Af Amer: 45 mL/min — ABNORMAL LOW (ref >90)
GFR, EST NON AFRICAN AMERICAN: 38 mL/min — AB (ref >90)
Glucose, Bld: 81 mg/dL (ref 70–99)
Potassium: 4.1 mEq/L (ref 3.7–5.3)
SODIUM: 138 meq/L (ref 137–147)
Total Protein: 5 g/dL — ABNORMAL LOW (ref 6.0–8.3)

## 2013-11-12 LAB — CBC
HCT: 36.5 % (ref 36.0–46.0)
Hemoglobin: 12.5 g/dL (ref 12.0–15.0)
MCH: 29.7 pg (ref 26.0–34.0)
MCHC: 34.2 g/dL (ref 30.0–36.0)
MCV: 86.7 fL (ref 78.0–100.0)
Platelets: 303 10*3/uL (ref 150–400)
RBC: 4.21 MIL/uL (ref 3.87–5.11)
RDW: 12.9 % (ref 11.5–15.5)
WBC: 15.3 10*3/uL — ABNORMAL HIGH (ref 4.0–10.5)

## 2013-11-12 MED ORDER — ACETAMINOPHEN 10 MG/ML IV SOLN
1000.0000 mg | Freq: Four times a day (QID) | INTRAVENOUS | Status: AC
  Administered 2013-11-12 (×2): 1000 mg via INTRAVENOUS
  Filled 2013-11-12 (×3): qty 100

## 2013-11-12 MED ORDER — BUPIVACAINE LIPOSOME 1.3 % IJ SUSP
20.0000 mL | Freq: Once | INTRAMUSCULAR | Status: AC
  Administered 2013-11-12: 20 mL
  Filled 2013-11-12: qty 20

## 2013-11-12 MED ORDER — ACETAMINOPHEN 10 MG/ML IV SOLN
INTRAVENOUS | Status: DC | PRN
  Administered 2013-11-12: 1000 mg via INTRAVENOUS

## 2013-11-12 MED ORDER — ACETAMINOPHEN 10 MG/ML IV SOLN
1000.0000 mg | Freq: Four times a day (QID) | INTRAVENOUS | Status: DC
  Filled 2013-11-12 (×4): qty 100

## 2013-11-12 MED ORDER — GLYCOPYRROLATE 0.2 MG/ML IJ SOLN
INTRAMUSCULAR | Status: DC | PRN
  Administered 2013-11-12: 0.6 mg via INTRAVENOUS

## 2013-11-12 MED ORDER — HYDROMORPHONE HCL PF 1 MG/ML IJ SOLN: 0.2500 mg | INTRAMUSCULAR | Status: DC | PRN

## 2013-11-12 MED ORDER — NEOSTIGMINE METHYLSULFATE 1 MG/ML IJ SOLN
INTRAMUSCULAR | Status: DC | PRN
  Administered 2013-11-12: 4 mg via INTRAVENOUS

## 2013-11-12 MED ORDER — BELLADONNA ALKALOIDS-OPIUM 16.2-60 MG RE SUPP
1.0000 | Freq: Three times a day (TID) | RECTAL | Status: DC | PRN
  Administered 2013-11-12: 1 via RECTAL
  Filled 2013-11-12 (×2): qty 1

## 2013-11-12 MED ORDER — PROPOFOL 10 MG/ML IV EMUL
INTRAVENOUS | Status: AC
  Filled 2013-11-12: qty 20

## 2013-11-12 MED ORDER — POLYETHYLENE GLYCOL 3350 17 G PO PACK
17.0000 g | PACK | Freq: Every day | ORAL | Status: DC | PRN
  Filled 2013-11-12: qty 1

## 2013-11-12 MED ORDER — ZOLPIDEM TARTRATE 5 MG PO TABS: 5.0000 mg | ORAL_TABLET | Freq: Every evening | ORAL | Status: DC | PRN

## 2013-11-12 MED ORDER — ACETAMINOPHEN 160 MG/5ML PO SOLN
1000.0000 mg | Freq: Four times a day (QID) | ORAL | Status: AC
  Administered 2013-11-12: 1000 mg via ORAL
  Filled 2013-11-12 (×3): qty 40

## 2013-11-12 MED ORDER — METOCLOPRAMIDE HCL 5 MG/ML IJ SOLN: 10.0000 mg | Freq: Once | INTRAMUSCULAR | Status: DC | PRN

## 2013-11-12 MED ORDER — KETOROLAC TROMETHAMINE 30 MG/ML IJ SOLN
INTRAMUSCULAR | Status: DC | PRN
  Administered 2013-11-12: 15 mg via INTRAVENOUS

## 2013-11-12 MED ORDER — DOCUSATE SODIUM 100 MG PO CAPS
100.0000 mg | ORAL_CAPSULE | Freq: Two times a day (BID) | ORAL | Status: DC
  Administered 2013-11-12 – 2013-11-13 (×3): 100 mg via ORAL
  Filled 2013-11-12 (×6): qty 1

## 2013-11-12 MED ORDER — ONDANSETRON HCL 4 MG/2ML IJ SOLN
INTRAMUSCULAR | Status: DC | PRN
  Administered 2013-11-12: 4 mg via INTRAVENOUS

## 2013-11-12 MED ORDER — CIPROFLOXACIN HCL 500 MG PO TABS
500.0000 mg | ORAL_TABLET | Freq: Two times a day (BID) | ORAL | Status: DC
  Administered 2013-11-12 – 2013-11-13 (×3): 500 mg via ORAL
  Filled 2013-11-12 (×5): qty 1

## 2013-11-12 MED ORDER — KETOROLAC TROMETHAMINE 15 MG/ML IJ SOLN
15.0000 mg | Freq: Four times a day (QID) | INTRAMUSCULAR | Status: DC
  Administered 2013-11-12 – 2013-11-13 (×5): 15 mg via INTRAVENOUS
  Filled 2013-11-12 (×11): qty 1

## 2013-11-12 MED ORDER — MEPERIDINE HCL 25 MG/ML IJ SOLN
INTRAMUSCULAR | Status: AC
  Filled 2013-11-12: qty 1

## 2013-11-12 MED ORDER — FENTANYL CITRATE 0.05 MG/ML IJ SOLN
INTRAMUSCULAR | Status: AC
  Filled 2013-11-12: qty 2

## 2013-11-12 NOTE — Transfer of Care (Signed)
Immediate Anesthesia Transfer of Care Note  Patient: Jennifer StarchLauren C Parks  Procedure(s) Performed: Procedure(s): CYSTOSCOPY (N/A) MINI LAPAROTOMY  Patient Location: PACU  Anesthesia Type:General  Level of Consciousness: awake, alert  and oriented  Airway & Oxygen Therapy: Patient Spontanous Breathing and Patient connected to nasal cannula oxygen  Post-op Assessment: Report given to PACU RN and Post -op Vital signs reviewed and stable  Post vital signs: Reviewed and stable  Complications: No apparent anesthesia complications

## 2013-11-12 NOTE — Anesthesia Postprocedure Evaluation (Signed)
  Anesthesia Post-op Note  Anesthesia Post Note  Patient: Jennifer Parks  Procedure(s) Performed: Procedure(s) (LRB): LAPAROSCOPY OPERATIVE WITH FULGERATION OF ENDOMETRIOSIS (N/A)  Anesthesia type: General  Patient location: [facility]  Post pain: Pain level controlled  Post assessment: Post-op Vital signs reviewed  Last Vitals:  Filed Vitals:   11/09/13 0958  BP: 108/70  Pulse: 76  Temp: 36.4 C  Resp: 16    Post vital signs: Reviewed  Level of consciousness: sedated  Complications: No apparent anesthesia complications

## 2013-11-12 NOTE — Progress Notes (Signed)
1 Day Post-Op Procedure(s) (LRB): CYSTOSCOPY (N/A) MINI LAPAROTOMY  Subjective: Patient reports tolerating PO.  Pain controlled with IV meds.  No n/v  Objective: I have reviewed patient's vital signs, intake and output, medications and labs.  Gen - NAD Abd - softly distended, NT.  + BS Inc - c/d/i, bandage in place w/ drain   Assessment: s/p Procedure(s): CYSTOSCOPY (N/A) MINI LAPAROTOMY: progressing well  Plan: Advance diet Encourage ambulation Advance to PO medication Discontinue IV fluids Continue foley due to cystotomy  LOS: 1 day    Jennifer Parks 11/12/2013, 8:53 AM

## 2013-11-12 NOTE — Addendum Note (Signed)
Addendum created 11/12/13 1451 by Turner DanielsJennifer L Wendy Mikles, CRNA   Modules edited: Notes Section   Notes Section:  File: [id number]

## 2013-11-12 NOTE — Anesthesia Postprocedure Evaluation (Signed)
  Anesthesia Post-op Note  Anesthesia Post Note  Patient: Jennifer Parks  Procedure(s) Performed: Procedure(s) (LRB): CYSTOSCOPY (N/A) MINI LAPAROTOMY  Anesthesia type: General  Patient location: PACU  Post pain: Pain level controlled  Post assessment: Post-op Vital signs reviewed  Last Vitals:  Filed Vitals:   11/12/13 0245  BP: 112/63  Pulse: 83  Temp:   Resp: 11    Post vital signs: Reviewed  Level of consciousness: sedated  Complications: No apparent anesthesia complications

## 2013-11-12 NOTE — Op Note (Signed)
Preoperative diagnosis:  1. Suspected bladder injury with intraperitoneal fluid   Postoperative diagnosis:  1. Intraperitoneal bladder injury 2. Extraperineal bladder injury   Procedure: 1. Cystoscopy, bilateral retrograde pyelograms with interpretation 2. Open Cystotomy closure  Surgeon: Crist Fat, MD Assistant: Dr. Ezzie Dural, MD  Anesthesia: General  Complications: None  Intraoperative findings: Cystoscopy confirmed a through and through trocar injury. Bilateral retrograde pyelograms were performed revealing a normal caliber ureter bilaterally without evidence of injury.   EBL: Minimal  Specimens: None  Indication: MANDI MATTIOLI is a 23 y.o. patient with history of endometriosis who is status post laparoscopic endometrial fulguration, complicated by trocar injury through the bladder dome and posterior bladder wall. She presented on postop day 2 with abdominal distention and significant abdominal pain. Workup revealed free fluid in the abdomen, and leukocytosis, and elevated creatinine. She then underwent a cystogram confirming extravasation of contrast. Given the intraperitoneal fluid and the high likelihood of a intraperitoneal bladder injury decision was made for exploration and repair of her bladder injury.   Description of procedure:  The patient was taken to the operating room and general anesthesia was induced.  The patient was placed in the dorsal lithotomy position, prepped and draped in the usual sterile fashion, and preoperative antibiotics were administered. A preoperative time-out was performed.   A 22 French 70 cystoscope was then gently passed to the patient's urethra and into the bladder. A 360 cystoscopic evaluation was performed revealing a through and through trocar injury in the anterior aspect of the patient's bladder dome as well as a posterior bladder wall. Retrograde pyelograms were then performed bilaterally with the above findings. The  cystoscope was removed an 52 French Foley catheter was inserted into the patient's ureter. After contemplating proceeding with laparoscopic versus open repair, a decision was then made to perform a open cystotomy closure.  A 4 cm Pfannenstiel incision was then made 2 cm above the pubic bone. Incision was carried down through subcutaneous fat to the rectus fascia. The fascia was then incised and dissected off of the rectus muscle. The rectus muscle was then split and a Balfour retractor placed to help retract the muscles laterally. A trocar injury with on the dome was then immediately apparent. However, we are unable to verify the posterior bladder injury. We then rescoped the patient to help orient [place] to the patient's injuries. At this point it was clear that we needed to make a large cystotomy using the anterior cystotomy in order to repair the posterior injury which was performed intravesically. The anterior cystotomy was opened with Metzenbaum scissors approximately 15 mm. This then gave [place] unfettered access to the posterior injury, and using 3-0 Vicryl the posterior cystotomy was closed in 2 layers in a running fashion. We then closed the anterior cystotomy in 2 layers using 3-0 Vicryl in a running fashion. 250 cc of normal saline was then instilled into the Foley catheter to ensure that the bladder was closed and there was no ongoing leak.  10 [place] JP drain was then placed through the previous suprapubic laparoscopic incision and laid gently on the anterior aspect of the bladder dome underneath the rectus muscle. The rectus fascia was then closed with 0 Vicryl in figure-of-eight interrupted sutures. The skin was then closed with 4-0 Monocryl in a subcuticular running fashion. 20 cc of [place] was then injected into the patient's incision. A 0 silk suture was used to secure the drain to the patient's skin. Dermabond was then applied to the  Pfannenstiel incision.  At the end of the case all laps needles  and sponges had been accounted for. There no perioperative complications or untoward events. The patient was returned to the PACU in excellent condition.   Disposition: The patient will be admitted as an inpatient, expect her drain will be removed in the next 48 hours, and the patient should be ready for discharge soon thereafter. She'll need her Foley catheter for approximately 14 days.   Crist FatBenjamin W. Gidget Quizhpi, M.D.

## 2013-11-12 NOTE — Progress Notes (Signed)
Urology Inpatient Progress Report S/p Cystotomy repair, 11/11/13  Intv/Subj: Up and walked this AM, sitting in chair Complaining of suprapubic pain, but otherwise feeling better No acute events overnight.  Objective: Vital: Filed Vitals:   11/12/13 0331 11/12/13 0430 11/12/13 0646 11/12/13 0931  BP: 120/64 114/58 114/58 112/63  Pulse: 95 91 99 104  Temp: 97.5 F (36.4 C) 98.1 F (36.7 C) 98.3 F (36.8 C) 98.4 F (36.9 C)  TempSrc: Oral Oral Oral Oral  Resp: 18 18 18 18   Height:      Weight:      SpO2: 99% 98% 100% 98%   I/Os: I/O last 3 completed shifts: In: 2400 [I.V.:2400] Out: 3885 [Urine:3650; Drains:160; Blood:75]  Past Medical History  Diagnosis Date  . Migraines   . Acne   . Depression     no meds  . Anxiety     no meds   Current Facility-Administered Medications  Medication Dose Route Frequency Provider Last Rate Last Dose  . acetaminophen (TYLENOL) solution 1,000 mg  1,000 mg Oral Q6H Crist FatBenjamin W Herrick, MD   1,000 mg at 11/12/13 0818   Or  . acetaminophen (OFIRMEV) IV 1,000 mg  1,000 mg Intravenous Q6H Crist FatBenjamin W Herrick, MD      . ciprofloxacin (CIPRO) tablet 500 mg  500 mg Oral BID Crist FatBenjamin W Herrick, MD   500 mg at 11/12/13 0907  . docusate sodium (COLACE) capsule 100 mg  100 mg Oral BID Crist FatBenjamin W Herrick, MD      . ketorolac (TORADOL) 15 MG/ML injection 15 mg  15 mg Intravenous 4 times per day Crist FatBenjamin W Herrick, MD   15 mg at 11/12/13 [phone]  . menthol-cetylpyridinium (CEPACOL) lozenge 3 mg  1 lozenge Oral Q2H PRN Zelphia CairoGretchen Adkins, MD      . ondansetron [facility](ZOFRAN) tablet 4 mg  4 mg Oral Q6H PRN Zelphia CairoGretchen Adkins, MD       Or  . ondansetron [facility](ZOFRAN) injection 4 mg  4 mg Intravenous Q6H PRN Zelphia CairoGretchen Adkins, MD      . opium-belladonna (B&O SUPPRETTES) suppository 1 suppository  1 suppository Rectal Q8H PRN Crist FatBenjamin W Herrick, MD      . oxyCODONE-acetaminophen (PERCOCET/ROXICET) 5-325 MG per tablet 1-2 tablet  1-2 tablet Oral Q4H PRN Zelphia CairoGretchen Adkins, MD      .  polyethylene glycol (MIRALAX / GLYCOLAX) packet 17 g  17 g Oral Daily PRN Zelphia CairoGretchen Adkins, MD        Physical Exam:  General: Patient is in no apparent distress Lungs: Normal respiratory effort, chest expands symmetrically. Abdomen is soft, incision is c/d/i, JP draining serosanguinous fluid Foley draining clear yellow urine Ext: lower extremities symmetric  Lab Results:  Recent Labs  11/11/13 1155 11/12/13 0528  WBC 24.8* 15.3*  HGB 15.0 12.5  HCT 42.8 36.5    Recent Labs  11/11/13 1531 11/12/13 0528  NA 129* 138  K 5.1 4.1  CL 97 104  CO2 18* 22  GLUCOSE 100* 81  BUN 53* 25*  CREATININE 5.71* 1.83*  CALCIUM 9.3 8.4   No results found for this basename: LABPT, INR,  in the last 72 hours No results found for this basename: LABURIN,  in the last 72 hours Results for orders placed during the hospital encounter of 11/11/13  MRSA PCR SCREENING     Status: None   Collection Time    11/11/13  8:50 PM      Result Value Ref Range Status   MRSA by PCR NEGATIVE  NEGATIVE Final   Comment:            The GeneXpert MRSA Assay (FDA     approved for NASAL specimens     only), is one component of a     comprehensive MRSA colonization     surveillance program. It is not     intended to diagnose MRSA     infection nor to guide or     monitor treatment for     MRSA infections.    Studies/Results: none  Assessment: 1 Day Post-Op,s/p open cystotomy repair, doing well.  Plan: Routine post-op care per Dr. Renaldo Fiddler Will plan to send JP drain for creatinine tomorrow AM and d/c if there is no urine leak. (ordered) B&O suppositories would be good for pelvic pain/bladder spasms. (ordered)   Would expect patient could be d/c tomorrow afternoon. Will need foley for 10-14 days.  We will plan to see her in clinic for a cystogram and voiding trial.  We will contact her with time and date.  Berniece Salines W 11/12/2013, 11:11 AM

## 2013-11-13 LAB — COMPREHENSIVE METABOLIC PANEL
ALT: 11 U/L (ref 0–35)
AST: 20 U/L (ref 0–37)
Albumin: 2.4 g/dL — ABNORMAL LOW (ref 3.5–5.2)
Alkaline Phosphatase: 64 U/L (ref 39–117)
BILIRUBIN TOTAL: 0.2 mg/dL — AB (ref 0.3–1.2)
BUN: 7 mg/dL (ref 6–23)
CHLORIDE: 105 meq/L (ref 96–112)
CO2: 23 meq/L (ref 19–32)
CREATININE: 0.76 mg/dL (ref 0.50–1.10)
Calcium: 8.3 mg/dL — ABNORMAL LOW (ref 8.4–10.5)
Glucose, Bld: 105 mg/dL — ABNORMAL HIGH (ref 70–99)
Potassium: 3.6 mEq/L — ABNORMAL LOW (ref 3.7–5.3)
SODIUM: 135 meq/L — AB (ref 137–147)
Total Protein: 5.7 g/dL — ABNORMAL LOW (ref 6.0–8.3)

## 2013-11-13 LAB — CREATININE, FLUID (PLEURAL, PERITONEAL, JP DRAINAGE): Creat, Fluid: 7 mg/dL

## 2013-11-13 LAB — CBC
HCT: 33.2 % — ABNORMAL LOW (ref 36.0–46.0)
Hemoglobin: 11.3 g/dL — ABNORMAL LOW (ref 12.0–15.0)
MCH: 30.1 pg (ref 26.0–34.0)
MCHC: 34 g/dL (ref 30.0–36.0)
MCV: 88.3 fL (ref 78.0–100.0)
Platelets: 305 10*3/uL (ref 150–400)
RBC: 3.76 MIL/uL — AB (ref 3.87–5.11)
RDW: 13.1 % (ref 11.5–15.5)
WBC: 11 10*3/uL — AB (ref 4.0–10.5)

## 2013-11-13 MED ORDER — OXYCODONE-ACETAMINOPHEN 5-325 MG PO TABS: 1.0000 | ORAL_TABLET | ORAL | Status: DC | PRN

## 2013-11-13 MED ORDER — DSS 100 MG PO CAPS: 100.0000 mg | ORAL_CAPSULE | Freq: Two times a day (BID) | ORAL | Status: DC

## 2013-11-13 MED ORDER — BISACODYL 10 MG RE SUPP
10.0000 mg | Freq: Once | RECTAL | Status: AC
  Administered 2013-11-13: 10 mg via RECTAL
  Filled 2013-11-13: qty 1

## 2013-11-13 NOTE — Progress Notes (Signed)
Patient ID: Jennifer Parks, female   DOB: 1991/06/30, 23 y.o.   MRN: [id number] 2 Days Post-Op  Subjective: Jaunice is s/p repair of a trocar injury to the bladder during exploration for endometriosis.   She has a JP drain in place and the volume has declined with only 20cc records since midnight.   JP creatinine is pending.   She has clear urine.  She complains of surgical site pain.  ROS:  Review of Systems  Constitutional: Negative for fever.  Gastrointestinal: Positive for abdominal pain and constipation.    Objective: Vital signs in last 24 hours: Temp:  [97.8 F (36.6 C)-98.8 F (37.1 C)] 98.3 F (36.8 C) (04/04 0522) Pulse Rate:  [90-104] 99 (04/04 0522) Resp:  [16-18] 16 (04/04 0522) BP: (103-117)/(54-70) 113/64 mmHg (04/04 0522) SpO2:  [97 %-98 %] 98 % (04/04 0522)  Intake/Output from previous day: 04/03 0701 - 04/04 0700 In: 1680 [P.O.:1680] Out: 4365 [Urine:4200; Drains:165] Intake/Output this shift:     Physical Exam  Constitutional: She is well-developed, well-nourished, and in no distress. No distress.  Abdominal:  There is a drain in the LLQ that is draining serous fluid but there is only about 10cc in the bulb.     Lab Results:   Recent Labs  11/12/13 0528 11/13/13 0830  WBC 15.3* 11.0*  HGB 12.5 11.3*  HCT 36.5 33.2*  PLT 303 305   BMET  Recent Labs  11/11/13 1531 11/12/13 0528  NA 129* 138  K 5.1 4.1  CL 97 104  CO2 18* 22  GLUCOSE 100* 81  BUN 53* 25*  CREATININE 5.71* 1.83*  CALCIUM 9.3 8.4   PT/INR No results found for this basename: LABPROT, INR,  in the last 72 hours ABG No results found for this basename: PHART, PCO2, PO2, HCO3,  in the last 72 hours  Studies/Results: Ct Pelvis Wo Contrast  11/11/2013   CLINICAL DATA:  Bladder leak on cystogram. Study performed to evaluate location of injury and location of contrast.  EXAM: CT PELVIS WITHOUT CONTRAST  TECHNIQUE: Multidetector CT imaging of the pelvis was performed following  the standard protocol without intravenous contrast.  COMPARISON:  DG CYSTOGRAM VOIDING dated 11/11/2013; CT ABD/PELVIS W CM dated 11/11/2013  FINDINGS: There is significant high density contrast within the pelvis which is associated multiple locules of gas following CT exam and cystogram. The location of the contrast is extraperitoneal, primarily surrounding the anterior and lateral aspects of the bladder base and extending up along the anterior abdominal wall extra peritoneal space.  Additionally, there is significant intraperitoneal fluid which has increased in density from 2 Hounsfield units to 47 Hounsfield units since the exam earlier in the day, showing continuity of the leak with the peritoneal cavity.  A Foley catheter has been clamped, showing accumulation of excreted contrast in the bladder. The precise location of the bladder leak is not identified.  IMPRESSION: 1. Extraperitoneal bladder injury. 2. The intraperitoneal fluid has also increased in density, consistent with urine ascites. 3. The findings were discussed with Zelphia Cairo on 11/11/2013 at 6:47 PM.   Electronically Signed   By: Rosalie Gums M.D.   On: 11/11/2013 18:47   Ct Abdomen Pelvis W Contrast  11/11/2013   CLINICAL DATA:  Postoperative pain.  Endometriosis.  EXAM: CT ABDOMEN AND PELVIS WITH CONTRAST  TECHNIQUE: Multidetector CT imaging of the abdomen and pelvis was performed using the standard protocol following bolus administration of intravenous contrast.  CONTRAST:  OMNIPAQUE IOHEXOL 300 MG/ML  SOLN  COMPARISON:  None.  FINDINGS: There is a large amount of free fluid in the abdomen. Free intraperitoneal gas and some anterior retroperitoneal gas is present. Retroperitoneal fluid density along the pericolic gutters and in the presacral space are noted. These findings are all most likely postoperative in nature rather than related to an inflammatory process. The anterior retroperitoneal fluid density measures almost 3 cm in thickness.   Bibasilar atelectasis.  Small pleural effusions.  The liver, gallbladder, spleen, pancreas, adrenal glands, and kidneys are within normal limits.  Prominent stool burden in the ascending colon. Moderate stool burden in the transverse and descending colon. No obstructing lesion. The rectum is somewhat edematous with stranding in the adjacent fat.  Uterus is unremarkable.  Adnexa are within normal limits.  IMPRESSION: Postoperative changes as described. There is a large amount of free fluid in the abdomen as well as significant fluid density in the anterior pelvic retroperitoneal space.   Electronically Signed   By: Maryclare BeanArt  Hoss M.D.   On: 11/11/2013 14:08   Dg Cystogram Voiding  11/11/2013   CLINICAL DATA:  Laparoscopy for endometriosis. CT exam shows intraperitoneal fluid and air. Patient is unable to void.  EXAM: VOIDING CYSTOURETHROGRAM  TECHNIQUE: After catheterization of the urinary bladder following sterile technique by nursing personnel, the bladder was filled with 350 ml Cysto-hypaque 30% by drip infusion. Serial spot images were obtained during bladder filling and voiding.  FLUOROSCOPY TIME:  1 min 42 seconds  COMPARISON:  CT exam on 11/11/2013  FINDINGS: During filling of the bladder, there is no evidence for leak. However, on the oblique images, there is contrast to the right of the bladder. Additionally, on delayed images performed during emptying of the bladder, there is contrast adjacent to the left base of the bladder.  A postdrainage KUB is performed, showing contrast within the pelvis, favoring an intraperitoneal location but is difficult to exclude extra peritoneal contrast.  IMPRESSION: 1. Study is positive for a leak from the urinary bladder. 2. The site of the leak is difficult to determine from this exam. 3. A CT of the pelvis without contrast is recommended for more definitive evaluation. 4. The findings were discussed with Dr. Alfredo BachAtkinson 11/11/2013 at 6:05 PM.   Electronically Signed   By: Rosalie GumsBeth   Brown M.D.   On: 11/11/2013 18:19   Dg Abd Portable 1v  11/12/2013   CLINICAL DATA:  Broken scalp deltoid. Evaluate for intraoperative foreign body.  EXAM: PORTABLE ABDOMEN - 1 VIEW  COMPARISON:  CT abdomen/ pelvis 11/11/2013  FINDINGS: Surgical drain projects over the anatomic pelvis. Contrast material in the cecum, ascending colon and terminal ileum. No metallic foreign body to suggest the missing scalp will tip. No acute osseous abnormality.  IMPRESSION: Negative for metallic foreign body.  These results were called by telephone at the time of interpretation on 11/12/2013 at 1:27 AM to Dr. Su GrandMARC NESI , who verbally acknowledged these results.   Electronically Signed   By: Malachy MoanHeath  McCullough M.D.   On: 11/12/2013 01:27    Anti-infectives: Anti-infectives   Start     Dose/Rate Route Frequency Ordered Stop   11/12/13 0800  ciprofloxacin (CIPRO) tablet 500 mg     500 mg Oral 2 times daily 11/12/13 0117     11/11/13 2245  ciprofloxacin (CIPRO) IVPB 400 mg     400 mg 200 mL/hr over 60 Minutes Intravenous  Once 11/11/13 2213 11/11/13 2256      Current Facility-Administered Medications  Medication Dose Route Frequency  Provider Last Rate Last Dose  . bisacodyl (DULCOLAX) suppository 10 mg  10 mg Rectal Once Zelphia Cairo, MD      . ciprofloxacin (CIPRO) tablet 500 mg  500 mg Oral BID Crist Fat, MD   500 mg at 11/12/13 2014  . docusate sodium (COLACE) capsule 100 mg  100 mg Oral BID Crist Fat, MD   100 mg at 11/12/13 2109  . ketorolac (TORADOL) 15 MG/ML injection 15 mg  15 mg Intravenous 4 times per day Crist Fat, MD   15 mg at 11/13/13 0800  . menthol-cetylpyridinium (CEPACOL) lozenge 3 mg  1 lozenge Oral Q2H PRN Zelphia Cairo, MD      . ondansetron [facility]) tablet 4 mg  4 mg Oral Q6H PRN Zelphia Cairo, MD       Or  . ondansetron [facility]) injection 4 mg  4 mg Intravenous Q6H PRN Zelphia Cairo, MD      . opium-belladonna (B&O SUPPRETTES) suppository 1 suppository  1  suppository Rectal Q8H PRN Crist Fat, MD   1 suppository at 11/12/13 1216  . oxyCODONE-acetaminophen (PERCOCET/ROXICET) 5-325 MG per tablet 1-2 tablet  1-2 tablet Oral Q4H PRN Zelphia Cairo, MD   1 tablet at 11/12/13 1124  . polyethylene glycol (MIRALAX / GLYCOLAX) packet 17 g  17 g Oral Daily PRN Zelphia Cairo, MD        Assessment: s/p Procedure(s): CYSTOSCOPY MINI LAPAROTOMY  She has reduced drainage from the JP and the urine is clear.   Plan: If the JP drainage is consistent with serum the JP can be removed.      LOS: 2 days    Anner Crete 11/13/2013

## 2013-11-13 NOTE — Discharge Summary (Signed)
Physician Discharge Summary  Patient ID: Jennifer Parks MRN: [id number] DOB/AGE: [birth year] 21 y.o.  Admit date: 11/11/2013 Discharge date: 11/13/2013  Admission Diagnoses:  Post operative pain  Discharge Diagnoses: cystotomy  Active Problems:   Postoperative pain   Endometriosis   Post-operative state   Discharged Condition: stable  Hospital Course: Pt was admitted with postoperative bladder injury.  Cystotomy was repaired by urology and pt was admitted for postoperative management.  Initially pain was controlled with IV meds and as she was able to tolerate PO intake - PO meds were given.  IV was discontinued once she was tolerating a regular diet.  JP was removed on POD2 and foley catheter was in place.  On the afternoon of POD2, she was able to tolerate a regular diet and ambulate without difficulty.  She was passing flatus and had a BM.  Discharged home with presciptions and instructions for follow up.    Consults: urology  Significant Diagnostic Studies: CT, [facility]  Treatments: surgery: cystotomy repair  Discharge Exam: Blood pressure 102/49, pulse 91, temperature 99 F (37.2 C), temperature source Oral, resp. rate 16, height 5' 4.5" (1.638 m), weight 71.838 kg (158 lb 6 oz), last menstrual period 10/23/2013, SpO2 98.00%. General appearance: alert and cooperative GI: soft, non-tender; bowel sounds normal; no masses,  no organomegaly Incision/Wound:clean and intact  Disposition: 01-Home or Self Care     Medication List    STOP taking these medications       metroNIDAZOLE 1 % gel  Commonly known as:  METROGEL      TAKE these medications       DSS 100 MG Caps  Take 100 mg by mouth 2 (two) times daily.     ibuprofen 200 MG tablet  Commonly known as:  ADVIL,MOTRIN  Take 400 mg by mouth every 6 (six) hours as needed for moderate pain.     multivitamin with minerals Tabs tablet  Take 1 tablet by mouth daily.     norethindrone-ethinyl estradiol 1-20 MG-MCG tablet   Commonly known as:  JUNEL FE,GILDESS FE,LOESTRIN FE  Take 1 tablet by mouth daily.     oxyCODONE-acetaminophen 5-325 MG per tablet  Commonly known as:  PERCOCET/ROXICET  Take 1-2 tablets by mouth every 4 (four) hours as needed for severe pain.     oxyCODONE-acetaminophen 5-325 MG per tablet  Commonly known as:  PERCOCET/ROXICET  Take 1-2 tablets by mouth every 4 (four) hours as needed for severe pain (moderate to severe pain (when tolerating fluids)).     simethicone 125 MG chewable tablet  Commonly known as:  MYLICON  Chew 250 mg by mouth every 6 (six) hours as needed for flatulence.     topiramate 50 MG tablet  Commonly known as:  TOPAMAX  Take 50 mg by mouth daily.           Follow-up Information   Schedule an appointment as soon as possible for a visit in 2 weeks to follow up.      Signed: Donzella Carrol 11/13/2013, 1:56 PM

## 2013-11-13 NOTE — Progress Notes (Signed)
Pt complains of pain this am.  Denies n/v, CP & SOB.  Pt reports passing flatus but no BM since Monday.  Pt was unable to sleep b/c of vivid nightmares.  AF, VSS Gen - appears uncomfortable, sitting in chair Abd - soft, less distended +BS Bandage with minimal drainage, JP in place Ext - NT, no edema  Labs pending - CBC, CMET, JP fluid  A/P:  POD4 from dx laparoscopy & POD#2 from cystotomy repair Will work on pain control today Miralax and dulcolax suppository for constipation Continue ambulation and regular diet If JP fluid neg for urine - for removal today per urology Continue foley cath for 14 days - to follow up w/ uro prior to removal Cipro while foley in place

## 2013-11-13 NOTE — CV Procedure (Signed)
Discharge instructions provided to patient and mother at bedside.  Activity, medications, follow up appointments, foley catheter care, leg bag instructions, when to call the doctor and community resources discussed.  NO questions at this time.  Patient left unit in wheelchair in stable condition with all personal belongings and prescriptions accompanied by staff.  Osvaldo AngstK. Lindie Roberson, RN------------------------------

## 2013-11-15 ENCOUNTER — Encounter (HOSPITAL_COMMUNITY): Payer: Self-pay | Admitting: Obstetrics and Gynecology

## 2014-03-08 ENCOUNTER — Other Ambulatory Visit (INDEPENDENT_AMBULATORY_CARE_PROVIDER_SITE_OTHER): Payer: 59

## 2014-03-08 ENCOUNTER — Other Ambulatory Visit: Payer: 59

## 2014-03-08 ENCOUNTER — Ambulatory Visit (INDEPENDENT_AMBULATORY_CARE_PROVIDER_SITE_OTHER): Payer: 59 | Admitting: Internal Medicine

## 2014-03-08 ENCOUNTER — Encounter: Payer: Self-pay | Admitting: Internal Medicine

## 2014-03-08 VITALS — BP 122/70 | HR 80 | Temp 98.4°F | Ht 64.5 in | Wt 151.8 lb

## 2014-03-08 DIAGNOSIS — Z0000 Encounter for general adult medical examination without abnormal findings: Secondary | ICD-10-CM

## 2014-03-08 DIAGNOSIS — Z23 Encounter for immunization: Secondary | ICD-10-CM

## 2014-03-08 LAB — BASIC METABOLIC PANEL
BUN: 11 mg/dL (ref 6–23)
CALCIUM: 9.3 mg/dL (ref 8.4–10.5)
CO2: 31 mEq/L (ref 19–32)
Chloride: 103 mEq/L (ref 96–112)
Creatinine, Ser: 0.8 mg/dL (ref 0.4–1.2)
GFR: 99.64 mL/min (ref >60.00)
GLUCOSE: 78 mg/dL (ref 70–99)
POTASSIUM: 4 meq/L (ref 3.5–5.1)
Sodium: 137 mEq/L (ref 135–145)

## 2014-03-08 LAB — LIPID PANEL
CHOLESTEROL: 167 mg/dL (ref 0–200)
HDL: 54.3 mg/dL (ref >39.00)
LDL CALC: 94 mg/dL (ref 0–99)
NonHDL: 112.7
Total CHOL/HDL Ratio: 3
Triglycerides: 96 mg/dL (ref 0.0–149.0)
VLDL: 19.2 mg/dL (ref 0.0–40.0)

## 2014-03-08 LAB — URINALYSIS, ROUTINE W REFLEX MICROSCOPIC
Bilirubin Urine: NEGATIVE
HGB URINE DIPSTICK: NEGATIVE
KETONES UR: NEGATIVE
Nitrite: NEGATIVE
PH: 6 (ref 5.0–8.0)
Specific Gravity, Urine: 1.015 (ref 1.000–1.030)
Total Protein, Urine: NEGATIVE
Urine Glucose: NEGATIVE
Urobilinogen, UA: 0.2 (ref 0.0–1.0)

## 2014-03-08 LAB — CBC WITH DIFFERENTIAL/PLATELET
BASOS ABS: 0 10*3/uL (ref 0.0–0.1)
Basophils Relative: 0.4 % (ref 0.0–3.0)
EOS PCT: 1.7 % (ref 0.0–5.0)
Eosinophils Absolute: 0.1 10*3/uL (ref 0.0–0.7)
HEMATOCRIT: 40.3 % (ref 36.0–46.0)
Hemoglobin: 13.7 g/dL (ref 12.0–15.0)
Lymphocytes Relative: 28.3 % (ref 12.0–46.0)
Lymphs Abs: 2 10*3/uL (ref 0.7–4.0)
MCHC: 34.1 g/dL (ref 30.0–36.0)
MCV: 86.5 fl (ref 78.0–100.0)
MONOS PCT: 10 % (ref 3.0–12.0)
Monocytes Absolute: 0.7 10*3/uL (ref 0.1–1.0)
Neutro Abs: 4.2 10*3/uL (ref 1.4–7.7)
Neutrophils Relative %: 59.6 % (ref 43.0–77.0)
PLATELETS: 338 10*3/uL (ref 150.0–400.0)
RBC: 4.66 Mil/uL (ref 3.87–5.11)
RDW: 13.1 % (ref 11.5–15.5)
WBC: 7 10*3/uL (ref 4.0–10.5)

## 2014-03-08 LAB — HEPATIC FUNCTION PANEL
ALK PHOS: 63 U/L (ref 39–117)
ALT: 13 U/L (ref 0–35)
AST: 22 U/L (ref 0–37)
Albumin: 3.9 g/dL (ref 3.5–5.2)
BILIRUBIN TOTAL: 0.6 mg/dL (ref 0.2–1.2)
Bilirubin, Direct: 0.1 mg/dL (ref 0.0–0.3)
Total Protein: 7.1 g/dL (ref 6.0–8.3)

## 2014-03-08 LAB — TSH: TSH: 0.95 u[IU]/mL (ref 0.35–4.50)

## 2014-03-08 MED ORDER — TETANUS-DIPHTHERIA TOXOIDS TD 5-2 LFU IM INJ: 0.5000 mL | INJECTION | Freq: Once | INTRAMUSCULAR | Status: DC

## 2014-03-08 NOTE — Progress Notes (Signed)
Subjective:    Patient ID: Jennifer Parks, female    DOB: [birth year], 23 y.o.   MRN: [id number]  HPI  patient is here today for annual physical. Patient feels well and has no complaints.  Also reviewed chronic medical issues and interval medical events  Past Medical History  Diagnosis Date  . Migraines   . Acne   . Depression     no meds  . Anxiety     no meds   Family History  Problem Relation Age of Onset  . Mental illness Mother   . Hyperlipidemia Father   . Lung cancer Maternal Grandfather   . Mental illness Paternal Grandmother   . Hyperlipidemia Paternal Grandfather   . Heart disease Paternal Grandfather    History  Substance Use Topics  . Smoking status: Never Smoker   . Smokeless tobacco: Never Used     Comment: single - student at [employer]  . Alcohol Use: Yes     Comment: social    Review of Systems  Constitutional: Negative for fatigue and unexpected weight change.  Respiratory: Negative for cough, shortness of breath and wheezing.   Cardiovascular: Negative for chest pain, palpitations and leg swelling.  Gastrointestinal: Negative for nausea, abdominal pain and diarrhea.  Neurological: Negative for dizziness, weakness, light-headedness and headaches.  Psychiatric/Behavioral: Negative for dysphoric mood. The patient is not nervous/anxious.   All other systems reviewed and are negative.      Objective:   Physical Exam  BP 122/70  Pulse 80  Temp(Src) 98.4 F (36.9 C) (Oral)  Ht 5' 4.5" (1.638 m)  Wt 151 lb 12 oz (68.833 kg)  BMI 25.65 kg/m2  SpO2 98%  LMP 01/10/2014 Wt Readings from Last 3 Encounters:  03/08/14 151 lb 12 oz (68.833 kg)  11/11/13 158 lb 6 oz (71.838 kg)  11/11/13 158 lb 6 oz (71.838 kg)   Constitutional: She appears well-developed and well-nourished. No distress.  HENT: Head: Normocephalic and atraumatic. Ears: B TMs ok, no erythema or effusion; Nose: Nose normal. Mouth/Throat: Oropharynx is clear and moist. No oropharyngeal  exudate.  Eyes: Conjunctivae and EOM are normal. Pupils are equal, round, and reactive to light. No scleral icterus.  Neck: Normal range of motion. Neck supple. No JVD present. No thyromegaly present.  Cardiovascular: Normal rate, regular rhythm and normal heart sounds.  No murmur heard. No BLE edema. Pulmonary/Chest: Effort normal and breath sounds normal. No respiratory distress. She has no wheezes.  Abdominal: Soft. Bowel sounds are normal. She exhibits no distension. There is no tenderness. no masses GU/breast: defer to gyn Musculoskeletal: Normal range of motion, no joint effusions. No gross deformities Neurological: She is alert and oriented to person, place, and time. No cranial nerve deficit. Coordination, balance, strength, speech and gait are normal.  Skin: Skin is warm and dry. No rash noted. No erythema.  Psychiatric: She has a normal mood and affect. Her behavior is normal. Judgment and thought content normal.    Lab Results  Component Value Date   WBC 11.0* 11/13/2013   HGB 11.3* 11/13/2013   HCT 33.2* 11/13/2013   PLT 305 11/13/2013   GLUCOSE 105* 11/13/2013   CHOL 157 07/30/2012   TRIG 95.0 07/30/2012   HDL 65.10 07/30/2012   LDLCALC 73 07/30/2012   ALT 11 11/13/2013   AST 20 11/13/2013   NA 135* 11/13/2013   K 3.6* 11/13/2013   CL 105 11/13/2013   CREATININE 0.76 11/13/2013   BUN 7 11/13/2013   CO2 23  11/13/2013   TSH 3.15 03/08/2013    Ct Pelvis Wo Contrast  11/11/2013   CLINICAL DATA:  Bladder leak on cystogram. Study performed to evaluate location of injury and location of contrast.  EXAM: CT PELVIS WITHOUT CONTRAST  TECHNIQUE: Multidetector CT imaging of the pelvis was performed following the standard protocol without intravenous contrast.  COMPARISON:  DG CYSTOGRAM VOIDING dated 11/11/2013; CT ABD/PELVIS W CM dated 11/11/2013  FINDINGS: There is significant high density contrast within the pelvis which is associated multiple locules of gas following CT exam and cystogram. The location of  the contrast is extraperitoneal, primarily surrounding the anterior and lateral aspects of the bladder base and extending up along the anterior abdominal wall extra peritoneal space.  Additionally, there is significant intraperitoneal fluid which has increased in density from 2 Hounsfield units to 47 Hounsfield units since the exam earlier in the day, showing continuity of the leak with the peritoneal cavity.  A Foley catheter has been clamped, showing accumulation of excreted contrast in the bladder. The precise location of the bladder leak is not identified.  IMPRESSION: 1. Extraperitoneal bladder injury. 2. The intraperitoneal fluid has also increased in density, consistent with urine ascites. 3. The findings were discussed with Zelphia Cairo on 11/11/2013 at 6:47 PM.   Electronically Signed   By: Rosalie Gums M.D.   On: 11/11/2013 18:47   Ct Abdomen Pelvis W Contrast  11/11/2013   CLINICAL DATA:  Postoperative pain.  Endometriosis.  EXAM: CT ABDOMEN AND PELVIS WITH CONTRAST  TECHNIQUE: Multidetector CT imaging of the abdomen and pelvis was performed using the standard protocol following bolus administration of intravenous contrast.  CONTRAST:  OMNIPAQUE IOHEXOL 300 MG/ML  SOLN  COMPARISON:  None.  FINDINGS: There is a large amount of free fluid in the abdomen. Free intraperitoneal gas and some anterior retroperitoneal gas is present. Retroperitoneal fluid density along the pericolic gutters and in the presacral space are noted. These findings are all most likely postoperative in nature rather than related to an inflammatory process. The anterior retroperitoneal fluid density measures almost 3 cm in thickness.  Bibasilar atelectasis.  Small pleural effusions.  The liver, gallbladder, spleen, pancreas, adrenal glands, and kidneys are within normal limits.  Prominent stool burden in the ascending colon. Moderate stool burden in the transverse and descending colon. No obstructing lesion. The rectum is  somewhat edematous with stranding in the adjacent fat.  Uterus is unremarkable.  Adnexa are within normal limits.  IMPRESSION: Postoperative changes as described. There is a large amount of free fluid in the abdomen as well as significant fluid density in the anterior pelvic retroperitoneal space.   Electronically Signed   By: Maryclare Bean M.D.   On: 11/11/2013 14:08   Dg Cystogram Voiding  11/11/2013   CLINICAL DATA:  Laparoscopy for endometriosis. CT exam shows intraperitoneal fluid and air. Patient is unable to void.  EXAM: VOIDING CYSTOURETHROGRAM  TECHNIQUE: After catheterization of the urinary bladder following sterile technique by nursing personnel, the bladder was filled with 350 ml Cysto-hypaque 30% by drip infusion. Serial spot images were obtained during bladder filling and voiding.  FLUOROSCOPY TIME:  1 min 42 seconds  COMPARISON:  CT exam on 11/11/2013  FINDINGS: During filling of the bladder, there is no evidence for leak. However, on the oblique images, there is contrast to the right of the bladder. Additionally, on delayed images performed during emptying of the bladder, there is contrast adjacent to the left base of the bladder.  A  postdrainage KUB is performed, showing contrast within the pelvis, favoring an intraperitoneal location but is difficult to exclude extra peritoneal contrast.  IMPRESSION: 1. Study is positive for a leak from the urinary bladder. 2. The site of the leak is difficult to determine from this exam. 3. A CT of the pelvis without contrast is recommended for more definitive evaluation. 4. The findings were discussed with Dr. Alfredo BachAtkinson 11/11/2013 at 6:05 PM.   Electronically Signed   By: Rosalie GumsBeth  Brown M.D.   On: 11/11/2013 18:19   Dg Abd Portable 1v  11/12/2013   CLINICAL DATA:  Broken scalp deltoid. Evaluate for intraoperative foreign body.  EXAM: PORTABLE ABDOMEN - 1 VIEW  COMPARISON:  CT abdomen/ pelvis 11/11/2013  FINDINGS: Surgical drain projects over the anatomic pelvis.  Contrast material in the cecum, ascending colon and terminal ileum. No metallic foreign body to suggest the missing scalp will tip. No acute osseous abnormality.  IMPRESSION: Negative for metallic foreign body.  These results were called by telephone at the time of interpretation on 11/12/2013 at 1:27 AM to Dr. Su GrandMARC NESI , who verbally acknowledged these results.   Electronically Signed   By: Malachy MoanHeath  McCullough M.D.   On: 11/12/2013 01:27   Dg C-arm 1-60 Min-no Report  11/17/2013   CLINICAL DATA:  Fluoroscopy was utilized by the requesting physician. No radiographic interpretation.  EXAM: DG C-ARM 1-60 MIN - NRPT [facility]  COMPARISON:  None.   Electronically Signed   By: Leslye Peerad  Services   On: 11/17/2013 14:55       Assessment & Plan:   CPX/v70.0 - Patient has been counseled on age-appropriate routine health concerns for screening and prevention. These are reviewed and up-to-date. Immunizations are up-to-date or declined. Labs ordered and reviewed.

## 2014-03-08 NOTE — Patient Instructions (Addendum)
It was good to see you today.  We have reviewed your prior records including labs and tests today  Health Maintenance reviewed - tetanus updated today - all other recommended immunizations and age-appropriate screenings are up-to-date. (have App transfer your student records to [employer], also ok to bring [place] a copy if needed)  Test(s) ordered today. Your results will be released to Export (or called to you) after review, usually within 72hours after test completion. If any changes need to be made, you will be notified at that same time.  Medications reviewed and updated, no changes recommended at this time.  Please schedule followup in 12-24 months for annual exam and labs, call sooner if problems.  Health Maintenance Adopting a healthy lifestyle and getting preventive care can go a long way to promote health and wellness. Talk with your health care provider about what schedule of regular examinations is right for you. This is a good chance for you to check in with your provider about disease prevention and staying healthy. In between checkups, there are plenty of things you can do on your own. Experts have done a lot of research about which lifestyle changes and preventive measures are most likely to keep you healthy. Ask your health care provider for more information. WEIGHT AND DIET  Eat a healthy diet  Be sure to include plenty of vegetables, fruits, low-fat dairy products, and lean protein.  Do not eat a lot of foods high in solid fats, added sugars, or salt.  Get regular exercise. This is one of the most important things you can do for your health.  Most adults should exercise for at least 150 minutes each week. The exercise should increase your heart rate and make you sweat (moderate-intensity exercise).  Most adults should also do strengthening exercises at least twice a week. This is in addition to the moderate-intensity exercise.  Maintain a healthy weight  Body mass index (BMI) is  a measurement that can be used to identify possible weight problems. It estimates body fat based on height and weight. Your health care provider can help determine your BMI and help you achieve or maintain a healthy weight.  For females 49 years of age and older:   A BMI below 18.5 is considered underweight.  A BMI of 18.5 to 24.9 is normal.  A BMI of 25 to 29.9 is considered overweight.  A BMI of 30 and above is considered obese.  Watch levels of cholesterol and blood lipids  You should start having your blood tested for lipids and cholesterol at 22 years of age, then have this test every 5 years.  You may need to have your cholesterol levels checked more often if:  Your lipid or cholesterol levels are high.  You are older than 22 years of age.  You are at high risk for heart disease.  CANCER SCREENING   Lung Cancer  Lung cancer screening is recommended for adults 44-50 years old who are at high risk for lung cancer because of a history of smoking.  A yearly low-dose CT scan of the lungs is recommended for people who:  Currently smoke.  Have quit within the past 15 years.  Have at least a 30-pack-year history of smoking. A pack year is smoking an average of one pack of cigarettes a day for 1 year.  Yearly screening should continue until it has been 15 years since you quit.  Yearly screening should stop if you develop a health problem that would prevent  you from having lung cancer treatment.  Breast Cancer  Practice breast self-awareness. This means understanding how your breasts normally appear and feel.  It also means doing regular breast self-exams. Let your health care provider know about any changes, no matter how small.  If you are in your 20s or 30s, you should have a clinical breast exam (CBE) by a health care provider every 1-3 years as part of a regular health exam.  If you are 32 or older, have a CBE every year. Also consider having a breast X-ray  (mammogram) every year.  If you have a family history of breast cancer, talk to your health care provider about genetic screening.  If you are at high risk for breast cancer, talk to your health care provider about having an MRI and a mammogram every year.  Breast cancer gene (BRCA) assessment is recommended for women who have family members with BRCA-related cancers. BRCA-related cancers include:  Breast.  Ovarian.  Tubal.  Peritoneal cancers.  Results of the assessment will determine the need for genetic counseling and BRCA1 and BRCA2 testing. Cervical Cancer Routine pelvic examinations to screen for cervical cancer are no longer recommended for nonpregnant women who are considered low risk for cancer of the pelvic organs (ovaries, uterus, and vagina) and who do not have symptoms. A pelvic examination may be necessary if you have symptoms including those associated with pelvic infections. Ask your health care provider if a screening pelvic exam is right for you.   The Pap test is the screening test for cervical cancer for women who are considered at risk.  If you had a hysterectomy for a problem that was not cancer or a condition that could lead to cancer, then you no longer need Pap tests.  If you are older than 65 years, and you have had normal Pap tests for the past 10 years, you no longer need to have Pap tests.  If you have had past treatment for cervical cancer or a condition that could lead to cancer, you need Pap tests and screening for cancer for at least 20 years after your treatment.  If you no longer get a Pap test, assess your risk factors if they change (such as having a new sexual partner). This can affect whether you should start being screened again.  Some women have medical problems that increase their chance of getting cervical cancer. If this is the case for you, your health care provider may recommend more frequent screening and Pap tests.  The human  papillomavirus (HPV) test is another test that may be used for cervical cancer screening. The HPV test looks for the virus that can cause cell changes in the cervix. The cells collected during the Pap test can be tested for HPV.  The HPV test can be used to screen women 29 years of age and older. Getting tested for HPV can extend the interval between normal Pap tests from three to five years.  An HPV test also should be used to screen women of any age who have unclear Pap test results.  After 22 years of age, women should have HPV testing as often as Pap tests.  Colorectal Cancer  This type of cancer can be detected and often prevented.  Routine colorectal cancer screening usually begins at 23 years of age and continues through 22 years of age.  Your health care provider may recommend screening at an earlier age if you have risk factors for colon cancer.  Your  health care provider may also recommend using home test kits to check for hidden blood in the stool.  A small camera at the end of a tube can be used to examine your colon directly (sigmoidoscopy or colonoscopy). This is done to check for the earliest forms of colorectal cancer.  Routine screening usually begins at age 19.  Direct examination of the colon should be repeated every 5-10 years through 22 years of age. However, you may need to be screened more often if early forms of precancerous polyps or small growths are found. Skin Cancer  Check your skin from head to toe regularly.  Tell your health care provider about any new moles or changes in moles, especially if there is a change in a mole's shape or color.  Also tell your health care provider if you have a mole that is larger than the size of a pencil eraser.  Always use sunscreen. Apply sunscreen liberally and repeatedly throughout the day.  Protect yourself by wearing long sleeves, pants, a wide-brimmed hat, and sunglasses whenever you are outside. HEART DISEASE,  DIABETES, AND HIGH BLOOD PRESSURE   Have your blood pressure checked at least every 1-2 years. High blood pressure causes heart disease and increases the risk of stroke.  If you are between 59 years and 95 years old, ask your health care provider if you should take aspirin to prevent strokes.  Have regular diabetes screenings. This involves taking a blood sample to check your fasting blood sugar level.  If you are at a normal weight and have a low risk for diabetes, have this test once every three years after 22 years of age.  If you are overweight and have a high risk for diabetes, consider being tested at a younger age or more often. PREVENTING INFECTION  Hepatitis B  If you have a higher risk for hepatitis B, you should be screened for this virus. You are considered at high risk for hepatitis B if:  You were born in a country where hepatitis B is common. Ask your health care provider which countries are considered high risk.  Your parents were born in a high-risk country, and you have not been immunized against hepatitis B (hepatitis B vaccine).  You have HIV or AIDS.  You use needles to inject street drugs.  You live with someone who has hepatitis B.  You have had sex with someone who has hepatitis B.  You get hemodialysis treatment.  You take certain medicines for conditions, including cancer, organ transplantation, and autoimmune conditions. Hepatitis C  Blood testing is recommended for:  Everyone born from [birth year] through 1964.  Anyone with known risk factors for hepatitis C. Sexually transmitted infections (STIs)  You should be screened for sexually transmitted infections (STIs) including gonorrhea and chlamydia if:  You are sexually active and are younger than 22 years of age.  You are older than 22 years of age and your health care provider tells you that you are at risk for this type of infection.  Your sexual activity has changed since you were last screened and  you are at an increased risk for chlamydia or gonorrhea. Ask your health care provider if you are at risk.  If you do not have HIV, but are at risk, it may be recommended that you take a prescription medicine daily to prevent HIV infection. This is called pre-exposure prophylaxis (PrEP). You are considered at risk if:  You are sexually active and do not regularly use condoms  or know the HIV status of your partner(s).  You take drugs by injection.  You are sexually active with a partner who has HIV. Talk with your health care provider about whether you are at high risk of being infected with HIV. If you choose to begin PrEP, you should first be tested for HIV. You should then be tested every 3 months for as long as you are taking PrEP.  PREGNANCY   If you are premenopausal and you may become pregnant, ask your health care provider about preconception counseling.  If you may become pregnant, take 400 to 800 micrograms (mcg) of folic acid every day.  If you want to prevent pregnancy, talk to your health care provider about birth control (contraception). OSTEOPOROSIS AND MENOPAUSE   Osteoporosis is a disease in which the bones lose minerals and strength with aging. This can result in serious bone fractures. Your risk for osteoporosis can be identified using a bone density scan.  If you are 68 years of age or older, or if you are at risk for osteoporosis and fractures, ask your health care provider if you should be screened.  Ask your health care provider whether you should take a calcium or vitamin D supplement to lower your risk for osteoporosis.  Menopause may have certain physical symptoms and risks.  Hormone replacement therapy may reduce some of these symptoms and risks. Talk to your health care provider about whether hormone replacement therapy is right for you.  HOME CARE INSTRUCTIONS   Schedule regular health, dental, and eye exams.  Stay current with your immunizations.   Do  not use any tobacco products including cigarettes, chewing tobacco, or electronic cigarettes.  If you are pregnant, do not drink alcohol.  If you are breastfeeding, limit how much and how often you drink alcohol.  Limit alcohol intake to no more than 1 drink per day for nonpregnant women. One drink equals 12 ounces of beer, 5 ounces of wine, or 1 ounces of hard liquor.  Do not use street drugs.  Do not share needles.  Ask your health care provider for help if you need support or information about quitting drugs.  Tell your health care provider if you often feel depressed.  Tell your health care provider if you have ever been abused or do not feel safe at home. Document Released: 02/11/2011 Document Revised: 12/13/2013 Document Reviewed: 06/30/2013 [facility] Patient Information 2015 [place], [place]. This information is not intended to replace advice given to you by your health care provider. Make sure you discuss any questions you have with your health care provider.

## 2014-03-08 NOTE — Progress Notes (Signed)
Pre visit review using our clinic review tool, if applicable. No additional management support is needed unless otherwise documented below in the visit note. 

## 2015-04-20 ENCOUNTER — Telehealth: Payer: 59 | Admitting: Physician Assistant

## 2015-04-20 DIAGNOSIS — J029 Acute pharyngitis, unspecified: Secondary | ICD-10-CM | POA: Diagnosis not present

## 2015-04-20 DIAGNOSIS — R05 Cough: Secondary | ICD-10-CM | POA: Diagnosis not present

## 2015-04-20 DIAGNOSIS — B9789 Other viral agents as the cause of diseases classified elsewhere: Secondary | ICD-10-CM

## 2015-04-20 DIAGNOSIS — R059 Cough, unspecified: Secondary | ICD-10-CM

## 2015-04-20 DIAGNOSIS — J028 Acute pharyngitis due to other specified organisms: Secondary | ICD-10-CM

## 2015-04-20 NOTE — Progress Notes (Signed)
We are sorry that you are not feeling well.  Here is how we plan to help!  Based on what you have shared with me it looks like you have upper respiratory tract inflammation that has resulted in a significant cough.  Inflammation and infection in the upper respiratory tract is commonly called bronchitis and has four common causes:  Allergies, Viral Infections, Acid Reflux and Bacterial Infections.  Allergies, viruses and acid reflux are treated by controlling symptoms or eliminating the cause. An example might be a cough caused by taking certain blood pressure medications. You stop the cough by changing the medication. Another example might be a cough caused by acid reflux. Controlling the reflux helps control the cough.  Based on your presentation I believe you most likely have A cough due to a virus.  This is called viral bronchitis and is best treated by rest, plenty of fluids and control of the cough.  You may use Ibuprofen or Tylenol as directed to help your symptoms.  Salt water gargles will help with sore throat.   In addition you may use A non-prescription cough medication called Robitussin DAC. Take 2 teaspoons every 8 hours or Delsym: take 2 teaspoons every 12 hours.    HOME CARE . Only take medications as instructed by your medical team. . Complete the entire course of an antibiotic. . Drink plenty of fluids and get plenty of rest. . Avoid close contacts especially the very young and the elderly . Cover your mouth if you cough or cough into your sleeve. . Always remember to wash your hands . A steam or ultrasonic humidifier can help congestion.    GET HELP RIGHT AWAY IF: . You develop worsening fever. . You become short of breath . You cough up blood. . Your symptoms persist after you have completed your treatment plan MAKE SURE YOU   Understand these instructions.  Will watch your condition.  Will get help right away if you are not doing well or get worse.  Your e-visit  answers were reviewed by a board certified advanced clinical practitioner to complete your personal care plan.  Depending on the condition, your plan could have included both over the counter or prescription medications. If there is a problem please reply  once you have received a response from your provider. Your safety is important to [place].  If you have drug allergies check your prescription carefully.    You can use MyChart to ask questions about today's visit, request a non-urgent call back, or ask for a work or school excuse for 24 hours related to this e-Visit. If it has been greater than 24 hours you will need to follow up with your provider, or enter a new e-Visit to address those concerns. You will get an e-mail in the next two days asking about your experience.  I hope that your e-visit has been valuable and will speed your recovery. Thank you for using e-visits.

## 2015-08-16 DIAGNOSIS — H5213 Myopia, bilateral: Secondary | ICD-10-CM | POA: Diagnosis not present

## 2015-08-18 MED FILL — CLINDAMYCIN PH 1% GEL: 1 | 30 days supply | Qty: 60 | Fill #1

## 2015-09-20 MED FILL — LO LOESTRIN FE 1-10 TABLET: 1 MG-10 MCG | 84 days supply | Qty: 84 | Fill #0

## 2015-10-02 MED FILL — FLUoxetine HCL 10 MG CAPS: 10 | 15 days supply | Qty: 30 | Fill #0

## 2015-10-03 MED FILL — CLINDAMYCIN PH 1% GEL: 1 | 30 days supply | Qty: 60 | Fill #2

## 2015-10-13 ENCOUNTER — Encounter: Payer: Self-pay | Admitting: *Deleted

## 2015-10-13 ENCOUNTER — Encounter: Payer: 59 | Attending: Internal Medicine | Admitting: *Deleted

## 2015-10-13 VITALS — Wt 163.0 lb

## 2015-10-13 DIAGNOSIS — Z713 Dietary counseling and surveillance: Secondary | ICD-10-CM | POA: Insufficient documentation

## 2015-10-13 DIAGNOSIS — F509 Eating disorder, unspecified: Secondary | ICD-10-CM

## 2015-10-13 NOTE — Patient Instructions (Addendum)
Therapists: Mathis DadBrett Debney [phone] Mike CrazeKarla Townsend [phone] Verlan FriendsSara Young [phone]   PCP options: Theora Gianottihelle Jeffrey  or Ricka BurdockSarah Webber [phone]

## 2015-10-13 NOTE — Progress Notes (Signed)
Appointment start time: 0800  Appointment end time: 0900  Patient was seen on 10/13/15 for nutrition counseling pertaining to disordered eating  Primary care provider: none yet Therapist: none yet Any other medical team members: none   Assessment:  This is Jennifer Parks's initial nutrition appointment.  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   Has been in therapy for anxiety/depression.  Was diagnosed with an eating disorder about a year ago.  Was binge eating a lot and taking laxatives.  Realized that was not successful and is no longer taking laxatives.  Still has a hard time controlling herself around food.  Feels like if something is there, she has to eat it.  She feels like she can't keep certain foods around the house.  Enjoys cooking and baking, but feels like she can't have those foods around the house.  Feels guilty about eating.  Has poor body image.  Feels like all her problems are due to her weight; knows in her mind that isn't true, but feels that way   Was successful weaning off laxative through parental interference.   Started grad school in 2015.  Has endometreosis.  Was started on some kind of injection, but stopped using it.  Tried [employer] [place] Diet per GYN and has other dieting style behaviors.  Is looking for ED treatment team and potentially new medical team providers who are more weight-neutral   Eating history: Length of time: since freshman/sophomore year college.  Laxative started around that time and binging for awhile Previous treatments: none Goals for RD meetings: feel better about herself and have more control over eating  Weight history:  Currently: 163 lb Highest weight: 163 lb   Lowest weight: <130 lb Most consistent weight: 140 lb What would you like to weigh:125 How has weight changed in the past year: has been up and down. Gained up to 160 and then lost 25 pounds and then gained it back  Medical Information:   Changes in hair, skin, nails since ED started: hair was falling out about 3 years ago Chewing/swallowing difficulties : none Relux or heartburn: none Trouble with teeth: none LMP without the use of hormones: cycles irregular even on hormones Constipation, diarrhea:  BM every day or every other day Some dizziness/lighteadness- related to being hungry.  Also gets shakiness migraines weekly- worse with restriction Negative for cold intolerance Denies poor energy Negative for mood changes   Mental health diagnosis: doesn't know her diagnosis; possibly EDNOS/OSFED   Dietary assessment: A typical day consists of 3meals and 1 snacks  Safe foods include: cereal, salads, sandwiches, cooking light recipes, vegetables, meat Avoided foods include: pasta, sweets (ice cream, cookies, cakes) Binge foods: mac-n-cheese, cookies  24 hour recall:  B: special k with skim milk; OJ L: lean shepherd's pie, asparagus Smoothie: yogurt, pineapple, guava D: spinach salad, chicken enchiladas  Administered EAT-26 Score significant>20 Patient score: 25  What Methods Do You Use To Control Your Weight (Compensatory behaviors)?           Restricting (calories, fat, carbs): yes  SIV: denies  Diet pills: denies  Laxatives: none currently  Diuretics: denies  Alcohol or drugs: 2-3 glasses wine/week  Exercise (what type): all or nothing.  Walks dog maybe once a week  Food rules or rituals (explain): denies  Binge: when she's gone out to eat pasta and then feels like "what the heck?"  Or stress from living with parents.  Once a  week  Estimated energy intake: 1200 kcal  Estimated energy needs: 2200 kcal 275 g CHO 110 g pro 73 g fat  Nutrition Diagnosis: NB-1.5 Disordered eating pattern As related to restriction and binge.  As evidenced by eating disorder.  Intervention/Goals: Nutrition counseling provided.  Gave recommendations for treatment team providers.  Discussed "food is fuel" and without  adequate fuel, the body doesn't function well.  Also, a starved brain doesn't work well and inadequate energy or inadequate carbohydrate makes mental illness worse. Discussed goals of nutrition therapy in eating disorder recovery.  Discussed reasons for binging: mental and physical. Discussed importance of eating adequately to reduce risk for binging.  Gave food log    Monitoring and Evaluation: Patient will follow up in 2 weeks.

## 2015-10-17 DIAGNOSIS — N62 Hypertrophy of breast: Secondary | ICD-10-CM | POA: Diagnosis not present

## 2015-10-19 ENCOUNTER — Encounter: Payer: Self-pay | Admitting: Family Medicine

## 2015-10-19 ENCOUNTER — Ambulatory Visit (INDEPENDENT_AMBULATORY_CARE_PROVIDER_SITE_OTHER): Payer: 59 | Admitting: Family Medicine

## 2015-10-19 VITALS — BP 100/62 | HR 94 | Temp 98.2°F | Ht 64.5 in | Wt 163.3 lb

## 2015-10-19 DIAGNOSIS — J029 Acute pharyngitis, unspecified: Secondary | ICD-10-CM

## 2015-10-19 LAB — POCT RAPID STREP A (OFFICE): RAPID STREP A SCREEN: NEGATIVE

## 2015-10-19 LAB — POCT INFLUENZA A/B
INFLUENZA A, POC: NEGATIVE
Influenza B, POC: NEGATIVE

## 2015-10-19 NOTE — Progress Notes (Signed)
Pre visit review using our clinic review tool, if applicable. No additional management support is needed unless otherwise documented below in the visit note. 

## 2015-10-19 NOTE — Progress Notes (Signed)
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  Past Medical History  Diagnosis Date  . Acne   . Depression     no meds  . Anxiety     no meds    Past Surgical History  Procedure Laterality Date  . Wisdom tooth extraction    . Laparoscopy N/A 11/09/2013    Procedure: LAPAROSCOPY OPERATIVE WITH FULGERATION OF ENDOMETRIOSIS;  Surgeon: Zelphia Cairo, MD;  Location: [facility];  Service: Gynecology;  Laterality: N/A;  . Cystoscopy N/A 11/11/2013    Procedure: CYSTOSCOPY;  Surgeon: Zelphia Cairo, MD;  Location: [facility];  Service: Gynecology;  Laterality: N/A;  . Laparotomy  11/11/2013    Procedure: MINI LAPAROTOMY;  Surgeon: Zelphia Cairo, MD;  Location: [facility];  Service: Gynecology;;    Family History  Problem Relation Age of Onset  . Mental illness Mother   . Hyperlipidemia Father   . Lung cancer Maternal Grandfather   . COPD Maternal Grandfather   . Mental illness Paternal Grandmother   . COPD Paternal Grandmother   . Hyperlipidemia Paternal Grandfather   . Heart disease Paternal Grandfather     Social History   Social History  . Marital Status: Single    Spouse Name: N/A  . Number of Children: N/A  . Years of Education: N/A   Social History Main Topics  . Smoking status: Never Smoker   . Smokeless tobacco: Never Used     Comment: single - student at [employer]  . Alcohol Use: Yes     Comment: social  . Drug Use: No  . Sexual Activity: Yes    Birth Control/ Protection: Pill   Other Topics Concern  . None   Social History Narrative     Current outpatient prescriptions:  .  FLUoxetine  (PROZAC) 20 MG capsule, Take 20 mg by mouth daily., Disp: , Rfl:  .  ibuprofen (ADVIL,MOTRIN) 200 MG tablet, Take 400 mg by mouth every 6 (six) hours as needed for moderate pain., Disp: , Rfl:  .  Multiple Vitamin (MULTIVITAMIN WITH MINERALS) TABS tablet, Take 1 tablet by mouth daily. Reported on 10/13/2015, Disp: , Rfl:  .  norethindrone-ethinyl estradiol (JUNEL FE,GILDESS FE,LOESTRIN FE) 1-20 MG-MCG tablet, Take 1 tablet by mouth daily., Disp: , Rfl:   Current facility-administered medications:  .  tetanus & diphtheria toxoids (adult) (TENIVAC) injection 0.5 mL, 0.5 mL, Intramuscular, Once, Newt Lukes, MD  [facility] Vitals:   10/19/15 1353  BP: 100/62  Pulse: 94  Temp: 98.2 F (36.8 C)    Body mass index is 27.61 kg/(m^2).  GENERAL: vitals reviewed and listed above, alert, oriented, appears well hydrated and in no acute distress  HEENT: atraumatic, conjunttiva clear, no obvious abnormalities on inspection of external nose and ears, normal appearance of ear canals and TMs, clear nasal congestion, mild post oropharyngeal erythema with PND, 1+ tonsillar edema, no exudate, no sinus TTP  NECK: no obvious masses on inspection  LUNGS: clear to auscultation bilaterally, no wheezes, rales or rhonchi, good air movement  CV: HRRR, no peripheral edema  MS: moves all extremities without noticeable abnormality  PSYCH: pleasant and cooperative, no obvious  depression or anxiety  ASSESSMENT AND PLAN:  Discussed the following assessment and plan:  Sore throat - Plan: POC Rapid Strep A, POC Influenza A/B, Culture, Group A Strep  -rapid flu and strep show negative - discuss limitations of these tests. Strep culture pending. - We discussed potential etiologies, with mild influenza versus VURI being most likely, and advised supportive care and monitoring. Discussed tamiflu risks and benefits and she opted not to do this. We discussed treatment side effects, likely course, potential  complications,  transmission, and signs of developing a serious illness. -of course, we advised to return or notify a doctor immediately if symptoms worsen or persist or new concerns arise.    Patient Instructions  Influenza like illness, Adult Influenza ("the flu") is a viral infection of the respiratory tract. It occurs more often in winter months because people spend more time in close contact with one another. Influenza can make you feel very sick. Influenza easily spreads from person to person (contagious). CAUSES  Influenza is caused by a virus that infects the respiratory tract. You can catch the virus by breathing in droplets from an infected person's cough or sneeze. You can also catch the virus by touching something that was recently contaminated with the virus and then touching your mouth, nose, or eyes. RISKS AND COMPLICATIONS You may be at risk for a more severe case of influenza if you smoke cigarettes, have diabetes, have chronic heart disease (such as heart failure) or lung disease (such as asthma), or if you have a weakened immune system. Elderly people and pregnant women are also at risk for more serious infections.  SIGNS AND SYMPTOMS  Symptoms typically last 4 to 10 days and may include:  Fever.  Chills.  Headache, body aches, and muscle aches.  Sore throat.  Chest discomfort and cough.  Poor appetite.  Weakness or feeling tired.  Dizziness.  Nausea or vomiting. DIAGNOSIS  Diagnosis of influenza is often made based on your history and a physical exam. A nose or throat swab test can be done to confirm the diagnosis. TREATMENT  In mild cases, influenza goes away on its own. Treatment is directed at relieving symptoms. For more severe cases, your health care provider may prescribe antiviral medicines to shorten the sickness. Antibiotic medicines are not effective because the infection is caused by a virus, not by bacteria. HOME CARE INSTRUCTIONS  Take medicines  only as directed by your health care provider.  Use a cool mist humidifier to make breathing easier.  Get plenty of rest until your temperature returns to normal. This usually takes 3 to 4 days.  Drink enough fluid to keep your urine clear or pale yellow.  Cover yourmouth and nosewhen coughing or sneezing,and wash your handswellto prevent thevirusfrom spreading.  Stay homefromwork orschool untilthe fever is gonefor at least 1-122full days. PREVENTION  An annual influenza vaccination (flu shot) is the best way to avoid getting influenza. An annual flu shot is now routinely recommended for all adults in the [place] SEEK MEDICAL CARE IF:  You experiencechest pain, yourcough worsens,or you producemore mucus.  Youhave nausea,vomiting, ordiarrhea.  Your fever returns or gets worse. SEEK IMMEDIATE MEDICAL CARE IF:  You havetrouble breathing, you become short of breath,or your skin ornails becomebluish.  You have severe painor stiffnessin the neck.  You develop a sudden headache, or pain in the face or ear.  You have nausea or vomiting that you cannot control. MAKE SURE YOU:   Understand these instructions.  Will watch  your condition.  Will get help right away if you are not doing well or get worse.   This information is not intended to replace advice given to you by your health care provider. Make sure you discuss any questions you have with your health care provider.   Document Released: 07/26/2000 Document Revised: 08/19/2014 Document Reviewed: 10/28/2011 Elsevier Interactive Patient Education [address].      [place], [place]

## 2015-10-19 NOTE — Patient Instructions (Signed)
Influenza like illness, Adult Influenza ("the flu") is a viral infection of the respiratory tract. It occurs more often in winter months because people spend more time in close contact with one another. Influenza can make you feel very sick. Influenza easily spreads from person to person (contagious). CAUSES  Influenza is caused by a virus that infects the respiratory tract. You can catch the virus by breathing in droplets from an infected person's cough or sneeze. You can also catch the virus by touching something that was recently contaminated with the virus and then touching your mouth, nose, or eyes. RISKS AND COMPLICATIONS You may be at risk for a more severe case of influenza if you smoke cigarettes, have diabetes, have chronic heart disease (such as heart failure) or lung disease (such as asthma), or if you have a weakened immune system. Elderly people and pregnant women are also at risk for more serious infections.  SIGNS AND SYMPTOMS  Symptoms typically last 4 to 10 days and may include:  Fever.  Chills.  Headache, body aches, and muscle aches.  Sore throat.  Chest discomfort and cough.  Poor appetite.  Weakness or feeling tired.  Dizziness.  Nausea or vomiting. DIAGNOSIS  Diagnosis of influenza is often made based on your history and a physical exam. A nose or throat swab test can be done to confirm the diagnosis. TREATMENT  In mild cases, influenza goes away on its own. Treatment is directed at relieving symptoms. For more severe cases, your health care provider may prescribe antiviral medicines to shorten the sickness. Antibiotic medicines are not effective because the infection is caused by a virus, not by bacteria. HOME CARE INSTRUCTIONS  Take medicines only as directed by your health care provider.  Use a cool mist humidifier to make breathing easier.  Get plenty of rest until your temperature returns to normal. This usually takes 3 to 4 days.  Drink enough fluid  to keep your urine clear or pale yellow.  Cover yourmouth and nosewhen coughing or sneezing,and wash your handswellto prevent thevirusfrom spreading.  Stay homefromwork orschool untilthe fever is gonefor at least 1-252full days. PREVENTION  An annual influenza vaccination (flu shot) is the best way to avoid getting influenza. An annual flu shot is now routinely recommended for all adults in the [place] SEEK MEDICAL CARE IF:  You experiencechest pain, yourcough worsens,or you producemore mucus.  Youhave nausea,vomiting, ordiarrhea.  Your fever returns or gets worse. SEEK IMMEDIATE MEDICAL CARE IF:  You havetrouble breathing, you become short of breath,or your skin ornails becomebluish.  You have severe painor stiffnessin the neck.  You develop a sudden headache, or pain in the face or ear.  You have nausea or vomiting that you cannot control. MAKE SURE YOU:   Understand these instructions.  Will watch your condition.  Will get help right away if you are not doing well or get worse.   This information is not intended to replace advice given to you by your health care provider. Make sure you discuss any questions you have with your health care provider.   Document Released: 07/26/2000 Document Revised: 08/19/2014 Document Reviewed: 10/28/2011 Elsevier Interactive Patient Education [employer] Elsevier Inc.

## 2015-10-21 LAB — CULTURE, GROUP A STREP

## 2015-10-26 ENCOUNTER — Encounter: Payer: Self-pay | Admitting: *Deleted

## 2015-10-26 ENCOUNTER — Encounter: Payer: 59 | Admitting: *Deleted

## 2015-10-26 DIAGNOSIS — F509 Eating disorder, unspecified: Secondary | ICD-10-CM

## 2015-10-26 DIAGNOSIS — Z713 Dietary counseling and surveillance: Secondary | ICD-10-CM | POA: Diagnosis not present

## 2015-10-26 NOTE — Patient Instructions (Addendum)
Therapists: Brett Debney [phone] Mike CrazeKarla Townsend [phone] Verlan FrMathis DadiendsSara Young [phone]   PCP options: Theora GianottiChelle Jeffrey or Ricka BurdockSarah Webber [phone]   Aim to follow MyPlate recommendations for meals and snacks Complete food log

## 2015-10-26 NOTE — Progress Notes (Signed)
Appointment start time: 1500  Appointment end time: 1600  Patient was seen on 10/26/15 for nutrition counseling pertaining to disordered eating  Primary care provider: none yet Therapist: none yet Any other medical team members: none   Assessment:   [place] with [place] friends and had a good time. Just got over the flu.    Has not found therapist Food log got misplaced. Ate more than normally when in [place].  When she was sick, she wasn't really eating. appeite came back a few days ago Feels like binging happens more often when she is at the beach    Mental health diagnosis: doesn't know her diagnosis; possibly EDNOS/OSFED   Dietary assessment: A typical day consists of 3meals and 1-2 snacks  Safe foods include: cereal, salads, sandwiches, cooking light recipes, vegetables, meat Avoided foods include: pasta, sweets (ice cream, cookies, cakes) Binge foods: mac-n-cheese, cookies  24 hour recall:  B: life cereal, banana S: graham crackers L: pizza S: graham crackers D: pork chop, Asian salad, quinoa.  Milk Beverages: water and milk   What Methods Do You Use To Control Your Weight (Compensatory behaviors)?           Restricting (calories, fat, carbs): yes  SIV: denies  Diet pills: denies  Laxatives: none currently  Diuretics: denies  Alcohol or drugs: 2-3 glasses wine/week  Exercise (what type):none currently.  ADLs at hospital (6-8k steps). Walks dog maybe 1 day/week  Food rules or rituals (explain): denies  Binge: when she's gone out to eat pasta and then feels like "what the heck?"  Or stress from living with parents.  Once a week  Estimated energy intake: 1400 kcal  Estimated energy needs: 2200 kcal 275 g CHO 110 g pro 73 g fat  Nutrition Diagnosis: NB-1.5 Disordered eating pattern As related to restriction and binge.  As evidenced by eating disorder.  Intervention/Goals: Nutrition counseling provided.  Gave recommendations for treatment team  providers.  Discussed "food is fuel" and without adequate fuel, the body doesn't function well.  Also, a starved brain doesn't work well and inadequate energy or inadequate carbohydrate makes mental illness worse.  Discussed reasons for binging: mental and physical. Discussed importance of eating adequately to reduce risk for binging.   Discussed MyPlate recommendations for meal and snacks.  Thaila agreed to increase protein with breakfast and snacks as well as balance lunch more. Gave food log    Monitoring and Evaluation: Patient will follow up in 2 weeks.

## 2015-11-07 DIAGNOSIS — F338 Other recurrent depressive disorders: Secondary | ICD-10-CM | POA: Diagnosis not present

## 2015-11-07 DIAGNOSIS — F902 Attention-deficit hyperactivity disorder, combined type: Secondary | ICD-10-CM | POA: Diagnosis not present

## 2015-11-07 DIAGNOSIS — F419 Anxiety disorder, unspecified: Secondary | ICD-10-CM | POA: Diagnosis not present

## 2015-11-07 DIAGNOSIS — F4010 Social phobia, unspecified: Secondary | ICD-10-CM | POA: Diagnosis not present

## 2015-11-07 MED FILL — FLUoxetine HCL 10 MG CAPS: 10 | 90 days supply | Qty: 180 | Fill #1

## 2015-11-08 MED FILL — VYVANSE 10 MG CAPSULE: 10 | 30 days supply | Qty: 30 | Fill #0

## 2015-11-09 ENCOUNTER — Ambulatory Visit: Payer: 59 | Admitting: *Deleted

## 2015-11-15 DIAGNOSIS — F902 Attention-deficit hyperactivity disorder, combined type: Secondary | ICD-10-CM | POA: Diagnosis not present

## 2015-11-15 DIAGNOSIS — F419 Anxiety disorder, unspecified: Secondary | ICD-10-CM | POA: Diagnosis not present

## 2015-11-15 DIAGNOSIS — Z6828 Body mass index (BMI) 28.0-28.9, adult: Secondary | ICD-10-CM | POA: Diagnosis not present

## 2015-11-15 DIAGNOSIS — Z01419 Encounter for gynecological examination (general) (routine) without abnormal findings: Secondary | ICD-10-CM | POA: Diagnosis not present

## 2015-11-15 DIAGNOSIS — F338 Other recurrent depressive disorders: Secondary | ICD-10-CM | POA: Diagnosis not present

## 2015-11-21 ENCOUNTER — Encounter: Payer: Self-pay | Admitting: Internal Medicine

## 2015-11-21 ENCOUNTER — Ambulatory Visit (INDEPENDENT_AMBULATORY_CARE_PROVIDER_SITE_OTHER): Payer: 59 | Admitting: Internal Medicine

## 2015-11-21 VITALS — BP 118/72 | HR 88 | Temp 98.3°F | Resp 16 | Wt 160.0 lb

## 2015-11-21 DIAGNOSIS — F909 Attention-deficit hyperactivity disorder, unspecified type: Secondary | ICD-10-CM

## 2015-11-21 DIAGNOSIS — Z0000 Encounter for general adult medical examination without abnormal findings: Secondary | ICD-10-CM | POA: Diagnosis not present

## 2015-11-21 DIAGNOSIS — F419 Anxiety disorder, unspecified: Secondary | ICD-10-CM | POA: Diagnosis not present

## 2015-11-21 DIAGNOSIS — F509 Eating disorder, unspecified: Secondary | ICD-10-CM | POA: Diagnosis not present

## 2015-11-21 DIAGNOSIS — F329 Major depressive disorder, single episode, unspecified: Secondary | ICD-10-CM | POA: Diagnosis not present

## 2015-11-21 DIAGNOSIS — F32A Depression, unspecified: Secondary | ICD-10-CM

## 2015-11-21 NOTE — Assessment & Plan Note (Signed)
Stable, controlled Continue therapy Continue current dose of prozac 

## 2015-11-21 NOTE — Progress Notes (Signed)
Pre visit review using our clinic review tool, if applicable. No additional management support is needed unless otherwise documented below in the visit note. 

## 2015-11-21 NOTE — Assessment & Plan Note (Signed)
Diagnosed recently Following with ADHD clinic Has tried tow different medications, but did not tolerate them

## 2015-11-21 NOTE — Patient Instructions (Signed)
All other Health Maintenance issues reviewed.   All recommended immunizations and age-appropriate screenings are up-to-date or discussed.  No immunizations administered today.   Medications reviewed and updated.  No changes recommended at this time.   Please follow up annually  Health Maintenance, Female Adopting a healthy lifestyle and getting preventive care can go a long way to promote health and wellness. Talk with your health care provider about what schedule of regular examinations is right for you. This is a good chance for you to check in with your provider about disease prevention and staying healthy. In between checkups, there are plenty of things you can do on your own. Experts have done a lot of research about which lifestyle changes and preventive measures are most likely to keep you healthy. Ask your health care provider for more information. WEIGHT AND DIET  Eat a healthy diet  Be sure to include plenty of vegetables, fruits, low-fat dairy products, and lean protein.  Do not eat a lot of foods high in solid fats, added sugars, or salt.  Get regular exercise. This is one of the most important things you can do for your health.  Most adults should exercise for at least 150 minutes each week. The exercise should increase your heart rate and make you sweat (moderate-intensity exercise).  Most adults should also do strengthening exercises at least twice a week. This is in addition to the moderate-intensity exercise.  Maintain a healthy weight  Body mass index (BMI) is a measurement that can be used to identify possible weight problems. It estimates body fat based on height and weight. Your health care provider can help determine your BMI and help you achieve or maintain a healthy weight.  For females 34 years of age and older:   A BMI below 18.5 is considered underweight.  A BMI of 18.5 to 24.9 is normal.  A BMI of 25 to 29.9 is considered overweight.  A BMI of 30  and above is considered obese.  Watch levels of cholesterol and blood lipids  You should start having your blood tested for lipids and cholesterol at 24 years of age, then have this test every 5 years.  You may need to have your cholesterol levels checked more often if:  Your lipid or cholesterol levels are high.  You are older than 24 years of age.  You are at high risk for heart disease.  CANCER SCREENING   Lung Cancer  Lung cancer screening is recommended for adults 92-31 years old who are at high risk for lung cancer because of a history of smoking.  A yearly low-dose CT scan of the lungs is recommended for people who:  Currently smoke.  Have quit within the past 15 years.  Have at least a 30-pack-year history of smoking. A pack year is smoking an average of one pack of cigarettes a day for 1 year.  Yearly screening should continue until it has been 15 years since you quit.  Yearly screening should stop if you develop a health problem that would prevent you from having lung cancer treatment.  Breast Cancer  Practice breast self-awareness. This means understanding how your breasts normally appear and feel.  It also means doing regular breast self-exams. Let your health care provider know about any changes, no matter how small.  If you are in your 20s or 30s, you should have a clinical breast exam (CBE) by a health care provider every 1-3 years as part of a regular health  exam.  If you are 40 or older, have a CBE every year. Also consider having a breast X-ray (mammogram) every year.  If you have a family history of breast cancer, talk to your health care provider about genetic screening.  If you are at high risk for breast cancer, talk to your health care provider about having an MRI and a mammogram every year.  Breast cancer gene (BRCA) assessment is recommended for women who have family members with BRCA-related cancers. BRCA-related cancers  include:  Breast.  Ovarian.  Tubal.  Peritoneal cancers.  Results of the assessment will determine the need for genetic counseling and BRCA1 and BRCA2 testing. Cervical Cancer Your health care provider may recommend that you be screened regularly for cancer of the pelvic organs (ovaries, uterus, and vagina). This screening involves a pelvic examination, including checking for microscopic changes to the surface of your cervix (Pap test). You may be encouraged to have this screening done every 3 years, beginning at age 40.  For women ages 65-65, health care providers may recommend pelvic exams and Pap testing every 3 years, or they may recommend the Pap and pelvic exam, combined with testing for human papilloma virus (HPV), every 5 years. Some types of HPV increase your risk of cervical cancer. Testing for HPV may also be done on women of any age with unclear Pap test results.  Other health care providers may not recommend any screening for nonpregnant women who are considered low risk for pelvic cancer and who do not have symptoms. Ask your health care provider if a screening pelvic exam is right for you.  If you have had past treatment for cervical cancer or a condition that could lead to cancer, you need Pap tests and screening for cancer for at least 20 years after your treatment. If Pap tests have been discontinued, your risk factors (such as having a new sexual partner) need to be reassessed to determine if screening should resume. Some women have medical problems that increase the chance of getting cervical cancer. In these cases, your health care provider may recommend more frequent screening and Pap tests. Colorectal Cancer  This type of cancer can be detected and often prevented.  Routine colorectal cancer screening usually begins at 24 years of age and continues through 24 years of age.  Your health care provider may recommend screening at an earlier age if you have risk factors for  colon cancer.  Your health care provider may also recommend using home test kits to check for hidden blood in the stool.  A small camera at the end of a tube can be used to examine your colon directly (sigmoidoscopy or colonoscopy). This is done to check for the earliest forms of colorectal cancer.  Routine screening usually begins at age 29.  Direct examination of the colon should be repeated every 5-10 years through 24 years of age. However, you may need to be screened more often if early forms of precancerous polyps or small growths are found. Skin Cancer  Check your skin from head to toe regularly.  Tell your health care provider about any new moles or changes in moles, especially if there is a change in a mole's shape or color.  Also tell your health care provider if you have a mole that is larger than the size of a pencil eraser.  Always use sunscreen. Apply sunscreen liberally and repeatedly throughout the day.  Protect yourself by wearing long sleeves, pants, a wide-brimmed hat, and sunglasses  whenever you are outside. HEART DISEASE, DIABETES, AND HIGH BLOOD PRESSURE   High blood pressure causes heart disease and increases the risk of stroke. High blood pressure is more likely to develop in:  People who have blood pressure in the high end of the normal range (130-139/85-89 mm Hg).  People who are overweight or obese.  People who are African American.  If you are 28-53 years of age, have your blood pressure checked every 3-5 years. If you are 16 years of age or older, have your blood pressure checked every year. You should have your blood pressure measured twice--once when you are at a hospital or clinic, and once when you are not at a hospital or clinic. Record the average of the two measurements. To check your blood pressure when you are not at a hospital or clinic, you can use:  An automated blood pressure machine at a pharmacy.  A home blood pressure monitor.  If you  are between 38 years and 59 years old, ask your health care provider if you should take aspirin to prevent strokes.  Have regular diabetes screenings. This involves taking a blood sample to check your fasting blood sugar level.  If you are at a normal weight and have a low risk for diabetes, have this test once every three years after 24 years of age.  If you are overweight and have a high risk for diabetes, consider being tested at a younger age or more often. PREVENTING INFECTION  Hepatitis B  If you have a higher risk for hepatitis B, you should be screened for this virus. You are considered at high risk for hepatitis B if:  You were born in a country where hepatitis B is common. Ask your health care provider which countries are considered high risk.  Your parents were born in a high-risk country, and you have not been immunized against hepatitis B (hepatitis B vaccine).  You have HIV or AIDS.  You use needles to inject street drugs.  You live with someone who has hepatitis B.  You have had sex with someone who has hepatitis B.  You get hemodialysis treatment.  You take certain medicines for conditions, including cancer, organ transplantation, and autoimmune conditions. Hepatitis C  Blood testing is recommended for:  Everyone born from [birth year] through 1964.  Anyone with known risk factors for hepatitis C. Sexually transmitted infections (STIs)  You should be screened for sexually transmitted infections (STIs) including gonorrhea and chlamydia if:  You are sexually active and are younger than 24 years of age.  You are older than 24 years of age and your health care provider tells you that you are at risk for this type of infection.  Your sexual activity has changed since you were last screened and you are at an increased risk for chlamydia or gonorrhea. Ask your health care provider if you are at risk.  If you do not have HIV, but are at risk, it may be recommended that you  take a prescription medicine daily to prevent HIV infection. This is called pre-exposure prophylaxis (PrEP). You are considered at risk if:  You are sexually active and do not regularly use condoms or know the HIV status of your partner(s).  You take drugs by injection.  You are sexually active with a partner who has HIV. Talk with your health care provider about whether you are at high risk of being infected with HIV. If you choose to begin PrEP, you should first be  tested for HIV. You should then be tested every 3 months for as long as you are taking PrEP.  PREGNANCY   If you are premenopausal and you may become pregnant, ask your health care provider about preconception counseling.  If you may become pregnant, take 400 to 800 micrograms (mcg) of folic acid every day.  If you want to prevent pregnancy, talk to your health care provider about birth control (contraception). OSTEOPOROSIS AND MENOPAUSE   Osteoporosis is a disease in which the bones lose minerals and strength with aging. This can result in serious bone fractures. Your risk for osteoporosis can be identified using a bone density scan.  If you are 34 years of age or older, or if you are at risk for osteoporosis and fractures, ask your health care provider if you should be screened.  Ask your health care provider whether you should take a calcium or vitamin D supplement to lower your risk for osteoporosis.  Menopause may have certain physical symptoms and risks.  Hormone replacement therapy may reduce some of these symptoms and risks. Talk to your health care provider about whether hormone replacement therapy is right for you.  HOME CARE INSTRUCTIONS   Schedule regular health, dental, and eye exams.  Stay current with your immunizations.   Do not use any tobacco products including cigarettes, chewing tobacco, or electronic cigarettes.  If you are pregnant, do not drink alcohol.  If you are breastfeeding, limit how  much and how often you drink alcohol.  Limit alcohol intake to no more than 1 drink per day for nonpregnant women. One drink equals 12 ounces of beer, 5 ounces of wine, or 1 ounces of hard liquor.  Do not use street drugs.  Do not share needles.  Ask your health care provider for help if you need support or information about quitting drugs.  Tell your health care provider if you often feel depressed.  Tell your health care provider if you have ever been abused or do not feel safe at home.   This information is not intended to replace advice given to you by your health care provider. Make sure you discuss any questions you have with your health care provider.   Document Released: 02/11/2011 Document Revised: 08/19/2014 Document Reviewed: 06/30/2013 Elsevier Interactive Patient Education [employer].

## 2015-11-21 NOTE — Progress Notes (Signed)
Subjective:    Patient ID: Jennifer Parks, female    DOB: [birth year], 25 y.o.   MRN: [id number]  HPI She is here to establish with a new pcp.  She is here for a physical exam.   Sometimes she eats and and a couple of hours later feels hungry and can feel shaky and gets blurred vision. She tries to eat protein and carbs for breakfast.  She exercises 1-2 times a week.  She is seeing a nutritionist to help improve her diet.  She struggles with her weight and has an eating disorder.  She does binge intermittently.  Medications and allergies reviewed with patient and updated if appropriate.  Patient Active Problem List   Diagnosis Date Noted  . Anxiety 11/21/2015  . Eating disorder 11/21/2015  . Attention deficit hyperactivity disorder (ADHD) 11/21/2015  . Endometriosis 11/12/2013  . Depression   . Acne     Current Outpatient Prescriptions on File Prior to Visit  Medication Sig Dispense Refill  . FLUoxetine (PROZAC) 20 MG capsule Take 20 mg by mouth daily.    . Multiple Vitamin (MULTIVITAMIN WITH MINERALS) TABS tablet Take 1 tablet by mouth daily. Reported on 10/13/2015    . norethindrone-ethinyl estradiol (JUNEL FE,GILDESS FE,LOESTRIN FE) 1-20 MG-MCG tablet Take 1 tablet by mouth daily.     Current Facility-Administered Medications on File Prior to Visit  Medication Dose Route Frequency Provider Last Rate Last Dose  . tetanus & diphtheria toxoids (adult) (TENIVAC) injection 0.5 mL  0.5 mL Intramuscular Once Newt Lukes, MD        Past Medical History  Diagnosis Date  . Acne   . Depression     no meds  . Anxiety     no meds    Past Surgical History  Procedure Laterality Date  . Wisdom tooth extraction    . Laparoscopy N/A 11/09/2013    Procedure: LAPAROSCOPY OPERATIVE WITH FULGERATION OF ENDOMETRIOSIS;  Surgeon: Zelphia Cairo, MD;  Location: [facility];  Service: Gynecology;  Laterality: N/A;  . Cystoscopy N/A 11/11/2013    Procedure: CYSTOSCOPY;  Surgeon: Zelphia Cairo, MD;  Location: [facility];  Service: Gynecology;  Laterality: N/A;  . Laparotomy  11/11/2013    Procedure: MINI LAPAROTOMY;  Surgeon: Zelphia Cairo, MD;  Location: [facility];  Service: Gynecology;;    Social History   Social History  . Marital Status: Single    Spouse Name: N/A  . Number of Children: N/A  . Years of Education: N/A   Social History Main Topics  . Smoking status: Never Smoker   . Smokeless tobacco: Never Used     Comment: single - student at [employer]  . Alcohol Use: Yes     Comment: social  . Drug Use: No  . Sexual Activity: Yes    Birth Control/ Protection: Pill   Other Topics Concern  . None   Social History Narrative    Family History  Problem Relation Age of Onset  . Mental illness Mother   . Hyperlipidemia Father   . Lung cancer Maternal Grandfather   . COPD Maternal Grandfather   . Mental illness Paternal Grandmother   . COPD Paternal Grandmother   . Hyperlipidemia Paternal Grandfather   . Heart disease Paternal Grandfather     Review of Systems  Constitutional: Negative for fever, chills, appetite change and fatigue.  Eyes: Positive for visual disturbance (blurry vision at timest - ? low sugar).  Respiratory: Negative for cough, shortness of breath and  wheezing.   Cardiovascular: Negative for chest pain, palpitations and leg swelling.  Gastrointestinal: Negative for nausea, abdominal pain, diarrhea, constipation and blood in stool.       No Gerd  Genitourinary: Negative for dysuria and hematuria.  Musculoskeletal: Negative for myalgias, back pain and arthralgias.  Skin: Negative for color change and rash.  Neurological: Positive for dizziness (related to medication - vyvanse) and headaches (migraines). Negative for light-headedness.  Psychiatric/Behavioral: Positive for dysphoric mood. The patient is nervous/anxious.        Objective:   Filed Vitals:   11/21/15 1455  BP: 118/72  Pulse: 88  Temp: 98.3 F (36.8 C)  Resp: 16   Filed  Weights   11/21/15 1455  Weight: 160 lb (72.576 kg)   Body mass index is 27.05 kg/(m^2).   Physical Exam Constitutional: She appears well-developed and well-nourished. No distress.  HENT:  Head: Normocephalic and atraumatic.  Right Ear: External ear normal. Normal ear canal and TM Left Ear: External ear normal.  Normal ear canal and TM Mouth/Throat: Oropharynx is clear and moist.  Eyes: Conjunctivae and EOM are normal.  Neck: Neck supple. No tracheal deviation present. No thyromegaly present.  Cardiovascular: Normal rate, regular rhythm and normal heart sounds.   No murmur heard.  No edema. Pulmonary/Chest: Effort normal and breath sounds normal. No respiratory distress. She has no wheezes. She has no rales.  Breast: deferred to Gyn Abdominal: Soft. She exhibits no distension. There is no tenderness.  Lymphadenopathy: She has no cervical adenopathy.  Skin: Skin is warm and dry. She is not diaphoretic.  Psychiatric: She has a normal mood and affect. Her behavior is normal.        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  She is seeing a therapist Skin  - no changes, discussed monitoring her own skin Substance abuse - none   See Problem List for Assessment and Plan of chronic medical problems.  Follow up annually

## 2015-11-21 NOTE — Assessment & Plan Note (Addendum)
Stable, controlled Continue therapy Continue current dose of prozac

## 2015-11-21 NOTE — Assessment & Plan Note (Signed)
Continue therapy Continue prozac - she is happy with her dose

## 2015-11-28 DIAGNOSIS — F322 Major depressive disorder, single episode, severe without psychotic features: Secondary | ICD-10-CM | POA: Diagnosis not present

## 2015-11-28 MED FILL — CEPHALEXIN 500 MG CAPSULE: 500 | 30 days supply | Qty: 60 | Fill #1

## 2015-11-28 MED FILL — CLINDAMYCIN PH 1% GEL: 1 | 30 days supply | Qty: 60 | Fill #3

## 2015-11-30 ENCOUNTER — Encounter: Payer: 59 | Attending: Internal Medicine | Admitting: *Deleted

## 2015-11-30 ENCOUNTER — Encounter: Payer: Self-pay | Admitting: *Deleted

## 2015-11-30 DIAGNOSIS — F509 Eating disorder, unspecified: Secondary | ICD-10-CM

## 2015-11-30 DIAGNOSIS — Z713 Dietary counseling and surveillance: Secondary | ICD-10-CM | POA: Insufficient documentation

## 2015-11-30 NOTE — Progress Notes (Signed)
Appointment start time: 1600  Appointment end time: 1645  Patient was seen on 11/30/15 for nutrition counseling pertaining to disordered eating  Primary care provider: Dr. Lawerance BachBurns Therapist: Lewie LoronBurwell Anthony dical team members: none   Assessment:   Got primary care and gyn set up . 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   Has restarted with therapist, not an ED provider.  Lewie LoronBurwell Anthony and will be meeting every couple weeks.    States her eating has fallen into "start the diet on Monday" and then things fall off by midweek and then spiral by the weekend.  This past weekend was no different and she felt guilty afterwards     Mental health diagnosis: doesn't know her diagnosis; possibly EDNOS/OSFED   Dietary assessment: A typical day consists of 3meals and 1-2 snacks  Safe foods include: cereal, salads, sandwiches, cooking light recipes, vegetables, meat Avoided foods include: pasta, sweets (ice cream, cookies, cakes) Binge foods: mac-n-cheese, cookies  24 hour recall:  B; [place] sausage biscuit L: white chicken chili, cornbread, cookies, water D: ribs, fries, milk, sweet tarts  B: sausage biscuit L: rotisserie chicken, broccoli, mac-n-cheese, sprite zero, Insomnia cookie S: jelly beans and sweet tarts  Went looking for laxative last week, but couldn't find any.  Ate fiber gummies instead.  Is trying to "eat healthy" and follow nutrition rule.  Haven't used laxatives in a year and a half. Feels liek she has "gotten so big" feels liek she is hurting herself.     What Methods Do You Use To Control Your Weight (Compensatory behaviors)?           Restricting (calories, fat, carbs): yes  SIV: denies  Diet pills: denies  Laxatives: none currently  Diuretics: denies  Alcohol or drugs: 2-3 glasses wine/week  Exercise (what type):none currently.  ADLs at hospital (6-8k steps). Walks dog maybe 1 day/week  Food rules or rituals (explain):  denies  Binge: when she's gone out to eat pasta and then feels like "what the heck?"  Or stress from living with parents.  Once a week  Estimated energy intake: 1600 kcal  Estimated energy needs: 2200 kcal 275 g CHO 110 g pro 73 g fat  Nutrition Diagnosis: NB-1.5 Disordered eating pattern As related to restriction and binge.  As evidenced by eating disorder.  Intervention/Goals: Nutrition counseling provided.  Discussed "food is fuel" and without adequate fuel, the body doesn't function well.  Also, a starved brain doesn't work well and inadequate energy or inadequate carbohydrate makes mental illness worse.  Discussed reasons for binging: mental and physical. Discussed importance of eating adequately to reduce risk for binging.   Discussed MyPlate recommendations for meal and snacks.    Goals: Restricting leads to starvation and binging ultimately  Eat carbs as they are brain food and otherwise you make ketones which are poisonous - add them to lunch Don't eat things that don't taste good. That's punishment and not ok Bring lunch from home Eat afternoon snack with protein - [place] bar, peanut butter and cracker, chobani flips Download Recovery Record and invite me to connect with email  Life without Ed by Geroge BasemanJenni Schafer Overcoming Binge Eating by Dr. Gray BernhardtFairburn Embody by William Daltononnie Sobczak   Monitoring and Evaluation: Patient will follow up in 2 weeks.

## 2015-11-30 NOTE — Patient Instructions (Addendum)
Goals: Restricting leads to starvation and binging ultimately  Eat carbs as they are brain food and otherwise you make ketones which are poisonous - add them to lunch Don't eat things that don't taste good. That's punishment and not ok Bring lunch from home Eat afternoon snack with protein - [place] bar, peanut butter and cracker, chobani flips Download Recovery Record and invite me to connect with email  Life without Ed by Geroge BasemanJenni Schafer Overcoming Binge Eating by Dr. Gray BernhardtFairburn Embody by William Daltononnie Sobczak   Next visit 5/2 at 2 pm

## 2015-12-11 ENCOUNTER — Ambulatory Visit (INDEPENDENT_AMBULATORY_CARE_PROVIDER_SITE_OTHER): Payer: 59 | Admitting: Internal Medicine

## 2015-12-11 ENCOUNTER — Encounter: Payer: Self-pay | Admitting: Internal Medicine

## 2015-12-11 VITALS — BP 116/84 | HR 71 | Temp 98.7°F | Resp 16 | Wt 162.0 lb

## 2015-12-11 DIAGNOSIS — F329 Major depressive disorder, single episode, unspecified: Secondary | ICD-10-CM

## 2015-12-11 DIAGNOSIS — F419 Anxiety disorder, unspecified: Secondary | ICD-10-CM

## 2015-12-11 DIAGNOSIS — F32A Depression, unspecified: Secondary | ICD-10-CM

## 2015-12-11 MED ORDER — FLUOXETINE HCL 40 MG PO CAPS: 40.0000 mg | ORAL_CAPSULE | Freq: Every day | ORAL | Status: DC

## 2015-12-11 MED FILL — FLUoxetine HCL 40 MG CAPS: 40 | 90 days supply | Qty: 90 | Fill #0

## 2015-12-11 NOTE — Assessment & Plan Note (Signed)
Mild depression, no suicidal thoughts Increase in prozac should help  She will see her therapist regularly Follow up in 6 months, sooner if needed

## 2015-12-11 NOTE — Progress Notes (Signed)
Subjective:    Patient ID: Jennifer Parks, female    DOB: 1991-01-30, 25 y.o.   MRN: [id number]  HPI  She is here for an acute visit for worsening anxiety.  In the past month her anxiety has worsened and she feels like her Prozac is not working.  She continues to see her therapist and nutritionist regularly and they both feel she needs an increase in her dose of medication.  She agrees.  She has been taking medication daily as prescribed.  She has had 3-4 panic attacks in the past month.  She has also experienced difficulty sleeping at times, racing thoughts, palpitations and chest pain with the anxiety.   She has had some mild depression.  She denies suicidal thoughts.   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   Medications and allergies reviewed with patient and updated if appropriate.  Patient Active Problem List   Diagnosis Date Noted  . Anxiety 11/21/2015  . Eating disorder 11/21/2015  . Attention deficit hyperactivity disorder (ADHD) 11/21/2015  . Endometriosis 11/12/2013  . Depression   . Acne     Current Outpatient Prescriptions on File Prior to Visit  Medication Sig Dispense Refill  . FLUoxetine (PROZAC) 20 MG capsule Take 20 mg by mouth daily.    . Multiple Vitamin (MULTIVITAMIN WITH MINERALS) TABS tablet Take 1 tablet by mouth daily. Reported on 10/13/2015    . norethindrone-ethinyl estradiol (JUNEL FE,GILDESS FE,LOESTRIN FE) 1-20 MG-MCG tablet Take 1 tablet by mouth daily.     Current Facility-Administered Medications on File Prior to Visit  Medication Dose Route Frequency Provider Last Rate Last Dose  . tetanus & diphtheria toxoids (adult) (TENIVAC) injection 0.5 mL  0.5 mL Intramuscular Once Newt Lukes, MD        Past Medical History  Diagnosis Date  . Acne   . Depression     no meds  . Anxiety     no meds    Past Surgical History  Procedure Laterality Date    . Wisdom tooth extraction    . Laparoscopy N/A 11/09/2013    Procedure: LAPAROSCOPY OPERATIVE WITH FULGERATION OF ENDOMETRIOSIS;  Surgeon: Zelphia Cairo, MD;  Location: [facility];  Service: Gynecology;  Laterality: N/A;  . Cystoscopy N/A 11/11/2013    Procedure: CYSTOSCOPY;  Surgeon: Zelphia Cairo, MD;  Location: [facility];  Service: Gynecology;  Laterality: N/A;  . Laparotomy  11/11/2013    Procedure: MINI LAPAROTOMY;  Surgeon: Zelphia Cairo, MD;  Location: [facility];  Service: Gynecology;;    Social History   Social History  . Marital Status: Single    Spouse Name: N/A  . Number of Children: N/A  . Years of Education: N/A   Social History Main Topics  . Smoking status: Never Smoker   . Smokeless tobacco: Never Used     Comment: single - student at [employer]  . Alcohol Use: Yes     Comment: social  . Drug Use: No  . Sexual Activity: Yes    Birth Control/ Protection: Pill   Other Topics Concern  . Not on file   Social History Narrative    Family History  Problem Relation Age of Onset  . Mental illness Mother   . Hyperlipidemia Father   . Lung cancer Maternal Grandfather   . COPD Maternal Grandfather   . Mental illness Paternal Grandmother   . COPD Paternal Grandmother   .  Hyperlipidemia Paternal Grandfather   . Heart disease Paternal Grandfather     Review of Systems  Constitutional: Negative for appetite change and unexpected weight change.  Cardiovascular: Positive for chest pain and palpitations.  Neurological: Negative for light-headedness and headaches.  Psychiatric/Behavioral: Positive for sleep disturbance and dysphoric mood (mild). Negative for suicidal ideas.       Objective:   Filed Vitals:   12/11/15 0933  BP: 116/84  Pulse: 71  Temp: 98.7 F (37.1 C)  Resp: 16   Filed Weights   12/11/15 0933  Weight: 162 lb (73.483 kg)   Body mass index is 27.39 kg/(m^2).   Physical Exam  Constitutional: She appears well-developed and well-nourished. No distress.   Skin: She is not diaphoretic.  Psychiatric: Her behavior is normal. Judgment and thought content normal.  She appears anxious, but not depressed         Assessment & Plan:   See Problem List for Assessment and Plan of chronic medical problems.

## 2015-12-11 NOTE — Assessment & Plan Note (Signed)
Not controlled Increase prozac to 40 mg daily Continue with therapist She will return if she feels her anxiety is not better controlled over the next few weeks

## 2015-12-11 NOTE — Progress Notes (Signed)
Pre visit review using our clinic review tool, if applicable. No additional management support is needed unless otherwise documented below in the visit note. 

## 2015-12-11 NOTE — Patient Instructions (Signed)
   Medications reviewed and updated.  Changes include increasing the prozac to 40 mg daily.   Your prescription(s) have been submitted to your pharmacy. Please take as directed and contact our office if you believe you are having problem(s) with the medication(s).   Please followup in 6 months

## 2015-12-12 ENCOUNTER — Ambulatory Visit: Payer: 59 | Admitting: *Deleted

## 2015-12-12 DIAGNOSIS — F322 Major depressive disorder, single episode, severe without psychotic features: Secondary | ICD-10-CM | POA: Diagnosis not present

## 2015-12-12 MED FILL — ONDANSETRON ODT 4 MG TABLET: 4 | 7 days supply | Qty: 20 | Fill #0

## 2015-12-12 MED FILL — CIPROFLOXACIN HCL 500 MG TA: 500 | 5 days supply | Qty: 10 | Fill #0

## 2015-12-13 ENCOUNTER — Encounter (HOSPITAL_BASED_OUTPATIENT_CLINIC_OR_DEPARTMENT_OTHER): Payer: Self-pay | Admitting: *Deleted

## 2015-12-14 DIAGNOSIS — Z79899 Other long term (current) drug therapy: Secondary | ICD-10-CM | POA: Diagnosis not present

## 2015-12-14 DIAGNOSIS — F902 Attention-deficit hyperactivity disorder, combined type: Secondary | ICD-10-CM | POA: Diagnosis not present

## 2015-12-14 MED FILL — ADZENYS XR-ODT 3.1 MG TAB: 3.1 | 30 days supply | Qty: 30 | Fill #0

## 2015-12-14 MED FILL — HYDROCODON-APAP 5-325: 5-325 | 10 days supply | Qty: 40 | Fill #0

## 2015-12-15 ENCOUNTER — Other Ambulatory Visit: Payer: Self-pay | Admitting: Plastic Surgery

## 2015-12-15 DIAGNOSIS — N62 Hypertrophy of breast: Secondary | ICD-10-CM

## 2015-12-15 NOTE — H&P (Signed)
Jennifer Parks is an 25 y.o. female.   Chief Complaint: mammary hypertrophy HPI: The patient is a 25 yo female here history and physical prior to bilateral breast reduction surgery.  History:  She has extremely large breasts causing symptoms that include the following: Back pain (upper and lower) and neck pain. She frequently pins bra cups higher on straps for better lift and relief. Notices relief when holding breast up in her hands. Shoulder straps causing grooves, pain occasionally requiring padding. Pain medication is sometimes required with motrin and tylenol.  Her breasts are extremely large and fairly symmetric. She has hyperpigmentation of the inframammary area on both sides. The sternal to nipple distance on the right is 25 and the left is 26. She is 5 feet 3 inches tall and weighs 160 pounds. Preoperative bra size = 34G cup. The estimated excess breast tissue to be removed at the time of surgery is 420 grams on the left and 420 grams on the right. There is no known family history of breast cancer. She had an accidental puncture of the bladder during a laparoscopic surgery for evaluation for endometriosis.  Past Medical History  Diagnosis Date  . Acne   . Depression   . Anxiety   . Headache     takes ibuprofen    Past Surgical History  Procedure Laterality Date  . Wisdom tooth extraction    . Laparoscopy N/A 11/09/2013    Procedure: LAPAROSCOPY OPERATIVE WITH FULGERATION OF ENDOMETRIOSIS;  Surgeon: Gretchen Adkins, MD;  Location: [facility];  Service: Gynecology;  Laterality: N/A;  . Cystoscopy N/A 11/11/2013    Procedure: CYSTOSCOPY;  Surgeon: Gretchen Adkins, MD;  Location: [facility];  Service: Gynecology;  Laterality: N/A;  . Laparotomy  11/11/2013    Procedure: MINI LAPAROTOMY;  Surgeon: Gretchen Adkins, MD;  Location: [facility];  Service: Gynecology;;    Family History  Problem Relation Age of Onset  . Mental illness Mother   . Hyperlipidemia Father   . Lung cancer Maternal  Grandfather   . COPD Maternal Grandfather   . Mental illness Paternal Grandmother   . COPD Paternal Grandmother   . Hyperlipidemia Paternal Grandfather   . Heart disease Paternal Grandfather    Social History:  reports that she has never smoked. She has never used smokeless tobacco. She reports that she drinks alcohol. She reports that she does not use illicit drugs.  Allergies:  Allergies  Allergen Reactions  . Amoxicillin Anaphylaxis and Rash  . Penicillins Anaphylaxis    Pt states allergy is to all penicillins      (Not in a hospital admission)  No results found for this or any previous visit (from the past 48 hour(s)). No results found.  Review of Systems  Constitutional: Negative.   HENT: Negative.   Eyes: Negative.   Respiratory: Negative.   Cardiovascular: Negative.   Gastrointestinal: Negative.   Genitourinary: Negative.   Musculoskeletal: Negative.   Skin: Negative.   Neurological: Negative.   Psychiatric/Behavioral: Negative.     Last menstrual period 12/13/2015. Physical Exam  Constitutional: She is oriented to person, place, and time. She appears well-developed and well-nourished.  HENT:  Head: Normocephalic and atraumatic.  Eyes: EOM are normal. Pupils are equal, round, and reactive to light.  Cardiovascular: Normal rate.   Respiratory: Effort normal.  GI: Soft. She exhibits no distension. There is no tenderness.  Musculoskeletal: Normal range of motion.  Neurological: She is alert and oriented to person, place, and time.  Skin: Skin is   warm.  Psychiatric: She has a normal mood and affect. Her behavior is normal. Judgment and thought content normal.     Assessment/Plan Plan for bilateral breast reduction with possible liposuction.   Peggye FormCLAIRE S DILLINGHAM, DO 12/15/2015, 11:28 AM

## 2015-12-19 MED FILL — LO LOESTRIN FE 1-10 TABLET: 1 MG-10 MCG | 84 days supply | Qty: 84 | Fill #0

## 2015-12-20 ENCOUNTER — Ambulatory Visit (HOSPITAL_BASED_OUTPATIENT_CLINIC_OR_DEPARTMENT_OTHER): Payer: 59 | Admitting: Anesthesiology

## 2015-12-20 ENCOUNTER — Encounter (HOSPITAL_BASED_OUTPATIENT_CLINIC_OR_DEPARTMENT_OTHER)
Admission: RE | Disposition: A | Discharge status: Home / Self Care | Payer: Self-pay | Source: Ambulatory Visit | Attending: Plastic Surgery

## 2015-12-20 ENCOUNTER — Ambulatory Visit (HOSPITAL_BASED_OUTPATIENT_CLINIC_OR_DEPARTMENT_OTHER)
Admission: RE | Admit: 2015-12-20 | Discharge: 2015-12-20 | Disposition: A | Discharge status: Home / Self Care | Payer: 59 | Source: Ambulatory Visit | Attending: Plastic Surgery | Admitting: Plastic Surgery

## 2015-12-20 ENCOUNTER — Encounter (HOSPITAL_BASED_OUTPATIENT_CLINIC_OR_DEPARTMENT_OTHER): Payer: Self-pay | Admitting: *Deleted

## 2015-12-20 DIAGNOSIS — N62 Hypertrophy of breast: Secondary | ICD-10-CM | POA: Insufficient documentation

## 2015-12-20 DIAGNOSIS — Z880 Allergy status to penicillin: Secondary | ICD-10-CM | POA: Insufficient documentation

## 2015-12-20 DIAGNOSIS — M542 Cervicalgia: Secondary | ICD-10-CM | POA: Insufficient documentation

## 2015-12-20 DIAGNOSIS — M549 Dorsalgia, unspecified: Secondary | ICD-10-CM | POA: Diagnosis not present

## 2015-12-20 HISTORY — DX: Headache: R51

## 2015-12-20 HISTORY — DX: Headache, unspecified: R51.9

## 2015-12-20 HISTORY — PX: BREAST REDUCTION SURGERY: SHX8

## 2015-12-20 SURGERY — BREAST REDUCTION WITH LIPOSUCTION
Anesthesia: General | Site: Breast | Laterality: Bilateral

## 2015-12-20 MED ORDER — HYDROMORPHONE HCL 1 MG/ML IJ SOLN
INTRAMUSCULAR | Status: AC
  Filled 2015-12-20: qty 1

## 2015-12-20 MED ORDER — MIDAZOLAM HCL 2 MG/2ML IJ SOLN
1.0000 mg | INTRAMUSCULAR | Status: DC | PRN
  Administered 2015-12-20: 2 mg via INTRAVENOUS

## 2015-12-20 MED ORDER — ONDANSETRON HCL 4 MG/2ML IJ SOLN
INTRAMUSCULAR | Status: AC
  Filled 2015-12-20: qty 2

## 2015-12-20 MED ORDER — GLYCOPYRROLATE 0.2 MG/ML IJ SOLN: 0.2000 mg | Freq: Once | INTRAMUSCULAR | Status: DC | PRN

## 2015-12-20 MED ORDER — BUPIVACAINE-EPINEPHRINE 0.25% -1:200000 IJ SOLN
INTRAMUSCULAR | Status: DC | PRN
Start: 2015-12-20 — End: 2015-12-20
  Administered 2015-12-20: 19 mL

## 2015-12-20 MED ORDER — PHENYLEPHRINE 40 MCG/ML (10ML) SYRINGE FOR IV PUSH (FOR BLOOD PRESSURE SUPPORT)
PREFILLED_SYRINGE | INTRAVENOUS | Status: AC
  Filled 2015-12-20: qty 10

## 2015-12-20 MED ORDER — SCOPOLAMINE 1 MG/3DAYS TD PT72
MEDICATED_PATCH | TRANSDERMAL | Status: AC
  Filled 2015-12-20: qty 1

## 2015-12-20 MED ORDER — OXYCODONE HCL 5 MG PO TABS: 5.0000 mg | ORAL_TABLET | Freq: Once | ORAL | Status: DC | PRN

## 2015-12-20 MED ORDER — SODIUM BICARBONATE 4 % IV SOLN
INTRAVENOUS | Status: DC | PRN
  Administered 2015-12-20: 525 mL via INTRAMUSCULAR

## 2015-12-20 MED ORDER — MEPERIDINE HCL 25 MG/ML IJ SOLN: 6.2500 mg | INTRAMUSCULAR | Status: DC | PRN

## 2015-12-20 MED ORDER — MIDAZOLAM HCL 2 MG/2ML IJ SOLN
INTRAMUSCULAR | Status: AC
  Filled 2015-12-20: qty 2

## 2015-12-20 MED ORDER — HYDROMORPHONE HCL 1 MG/ML IJ SOLN
0.2500 mg | INTRAMUSCULAR | Status: DC | PRN
  Administered 2015-12-20 (×3): 0.5 mg via INTRAVENOUS

## 2015-12-20 MED ORDER — EPHEDRINE 5 MG/ML INJ
INTRAVENOUS | Status: AC
  Filled 2015-12-20: qty 10

## 2015-12-20 MED ORDER — EPINEPHRINE HCL 1 MG/ML IJ SOLN: INTRAMUSCULAR | Status: DC | PRN

## 2015-12-20 MED ORDER — DEXAMETHASONE SODIUM PHOSPHATE 10 MG/ML IJ SOLN
INTRAMUSCULAR | Status: AC
  Filled 2015-12-20: qty 1

## 2015-12-20 MED ORDER — CIPROFLOXACIN IN D5W 400 MG/200ML IV SOLN
400.0000 mg | INTRAVENOUS | Status: AC
  Administered 2015-12-20: 400 mg via INTRAVENOUS

## 2015-12-20 MED ORDER — SUFENTANIL CITRATE 50 MCG/ML IV SOLN
INTRAVENOUS | Status: AC
  Filled 2015-12-20: qty 1

## 2015-12-20 MED ORDER — DEXAMETHASONE SODIUM PHOSPHATE 4 MG/ML IJ SOLN
INTRAMUSCULAR | Status: DC | PRN
  Administered 2015-12-20: 10 mg via INTRAVENOUS

## 2015-12-20 MED ORDER — PROMETHAZINE HCL 25 MG/ML IJ SOLN: 6.2500 mg | INTRAMUSCULAR | Status: DC | PRN

## 2015-12-20 MED ORDER — LACTATED RINGERS IV SOLN
INTRAVENOUS | Status: DC
  Administered 2015-12-20 (×3): via INTRAVENOUS

## 2015-12-20 MED ORDER — SUCCINYLCHOLINE CHLORIDE 200 MG/10ML IV SOSY
PREFILLED_SYRINGE | INTRAVENOUS | Status: AC
  Filled 2015-12-20: qty 10

## 2015-12-20 MED ORDER — LIDOCAINE HCL (CARDIAC) 20 MG/ML IV SOLN
INTRAVENOUS | Status: DC | PRN
  Administered 2015-12-20: 50 mg via INTRAVENOUS

## 2015-12-20 MED ORDER — PROPOFOL 10 MG/ML IV BOLUS
INTRAVENOUS | Status: DC | PRN
  Administered 2015-12-20: 200 mg via INTRAVENOUS

## 2015-12-20 MED ORDER — SCOPOLAMINE 1 MG/3DAYS TD PT72
1.0000 | MEDICATED_PATCH | Freq: Once | TRANSDERMAL | Status: DC | PRN
  Administered 2015-12-20: 1.5 mg via TRANSDERMAL

## 2015-12-20 MED ORDER — SUCCINYLCHOLINE CHLORIDE 20 MG/ML IJ SOLN
INTRAMUSCULAR | Status: DC | PRN
  Administered 2015-12-20: 50 mg via INTRAVENOUS

## 2015-12-20 MED ORDER — ACETAMINOPHEN 10 MG/ML IV SOLN
1000.0000 mg | Freq: Once | INTRAVENOUS | Status: AC | PRN
  Administered 2015-12-20: 1000 mg via INTRAVENOUS

## 2015-12-20 MED ORDER — ATROPINE SULFATE 0.4 MG/ML IJ SOLN
INTRAMUSCULAR | Status: AC
  Filled 2015-12-20: qty 1

## 2015-12-20 MED ORDER — ACETAMINOPHEN 10 MG/ML IV SOLN
INTRAVENOUS | Status: AC
  Filled 2015-12-20: qty 100

## 2015-12-20 MED ORDER — OXYCODONE HCL 5 MG/5ML PO SOLN: 5.0000 mg | Freq: Once | ORAL | Status: DC | PRN

## 2015-12-20 MED ORDER — SUFENTANIL CITRATE 50 MCG/ML IV SOLN
INTRAVENOUS | Status: DC | PRN
  Administered 2015-12-20: 5 ug via INTRAVENOUS
  Administered 2015-12-20: 10 ug via INTRAVENOUS
  Administered 2015-12-20: 5 ug via INTRAVENOUS
  Administered 2015-12-20: 20 ug via INTRAVENOUS

## 2015-12-20 MED ORDER — LIDOCAINE 2% (20 MG/ML) 5 ML SYRINGE
INTRAMUSCULAR | Status: AC
  Filled 2015-12-20: qty 5

## 2015-12-20 MED ORDER — PHENYLEPHRINE HCL 10 MG/ML IJ SOLN
INTRAMUSCULAR | Status: DC | PRN
  Administered 2015-12-20: 40 ug via INTRAVENOUS
  Administered 2015-12-20: 80 ug via INTRAVENOUS

## 2015-12-20 MED ORDER — FENTANYL CITRATE (PF) 100 MCG/2ML IJ SOLN: 50.0000 ug | INTRAMUSCULAR | Status: DC | PRN

## 2015-12-20 MED ORDER — CIPROFLOXACIN IN D5W 400 MG/200ML IV SOLN
INTRAVENOUS | Status: AC
  Filled 2015-12-20: qty 200

## 2015-12-20 MED ORDER — ONDANSETRON HCL 4 MG/2ML IJ SOLN
INTRAMUSCULAR | Status: DC | PRN
  Administered 2015-12-20: 4 mg via INTRAVENOUS

## 2015-12-20 SURGICAL SUPPLY — 68 items
ADH SKN CLS APL DERMABOND .7 (GAUZE/BANDAGES/DRESSINGS) ×2
BAG DECANTER FOR FLEXI CONT (MISCELLANEOUS) ×3 IMPLANT
BINDER BREAST LRG (GAUZE/BANDAGES/DRESSINGS) ×3 IMPLANT
BINDER BREAST MEDIUM (GAUZE/BANDAGES/DRESSINGS) IMPLANT
BINDER BREAST XLRG (GAUZE/BANDAGES/DRESSINGS) IMPLANT
BINDER BREAST XXLRG (GAUZE/BANDAGES/DRESSINGS) IMPLANT
BLADE HEX COATED 2.75 (ELECTRODE) ×3 IMPLANT
BLADE KNIFE PERSONA 10 (BLADE) ×6 IMPLANT
BLADE SURG 15 STRL LF DISP TIS (BLADE) ×1 IMPLANT
BLADE SURG 15 STRL SS (BLADE) ×3
BNDG GAUZE ELAST 4 BULKY (GAUZE/BANDAGES/DRESSINGS) ×6 IMPLANT
CANISTER SUCT 1200ML W/VALVE (MISCELLANEOUS) ×3 IMPLANT
CHLORAPREP W/TINT 26ML (MISCELLANEOUS) ×3 IMPLANT
COVER BACK TABLE 60X90IN (DRAPES) ×3 IMPLANT
COVER MAYO STAND STRL (DRAPES) ×3 IMPLANT
DECANTER SPIKE VIAL GLASS SM (MISCELLANEOUS) IMPLANT
DERMABOND ADVANCED (GAUZE/BANDAGES/DRESSINGS) ×4
DERMABOND ADVANCED .7 DNX12 (GAUZE/BANDAGES/DRESSINGS) ×2 IMPLANT
DRAIN CHANNEL 19F RND (DRAIN) IMPLANT
DRAPE LAPAROSCOPIC ABDOMINAL (DRAPES) ×3 IMPLANT
DRSG PAD ABDOMINAL 8X10 ST (GAUZE/BANDAGES/DRESSINGS) ×6 IMPLANT
ELECT BLADE 4.0 EZ CLEAN MEGAD (MISCELLANEOUS)
ELECT REM PT RETURN 9FT ADLT (ELECTROSURGICAL) ×3
ELECTRODE BLDE 4.0 EZ CLN MEGD (MISCELLANEOUS) IMPLANT
ELECTRODE REM PT RTRN 9FT ADLT (ELECTROSURGICAL) ×1 IMPLANT
EVACUATOR SILICONE 100CC (DRAIN) IMPLANT
FILTER LIPOSUCTION (MISCELLANEOUS) IMPLANT
GLOVE BIO SURGEON STRL SZ 6.5 (GLOVE) ×10 IMPLANT
GLOVE BIO SURGEONS STRL SZ 6.5 (GLOVE) ×5
GLOVE BIOGEL PI IND STRL 7.0 (GLOVE) ×1 IMPLANT
GLOVE BIOGEL PI INDICATOR 7.0 (GLOVE) ×2
GLOVE ECLIPSE 6.5 STRL STRAW (GLOVE) ×3 IMPLANT
GOWN STRL REUS W/ TWL LRG LVL3 (GOWN DISPOSABLE) ×3 IMPLANT
GOWN STRL REUS W/TWL LRG LVL3 (GOWN DISPOSABLE) ×9
LIQUID BAND (GAUZE/BANDAGES/DRESSINGS) IMPLANT
NDL SAFETY ECLIPSE 18X1.5 (NEEDLE) ×1 IMPLANT
NEEDLE HYPO 18GX1.5 SHARP (NEEDLE) ×3
NEEDLE HYPO 25X1 1.5 SAFETY (NEEDLE) ×3 IMPLANT
NS IRRIG 1000ML POUR BTL (IV SOLUTION) ×6 IMPLANT
PACK BASIN DAY SURGERY FS (CUSTOM PROCEDURE TRAY) ×3 IMPLANT
PAD ALCOHOL SWAB (MISCELLANEOUS) ×6 IMPLANT
PENCIL BUTTON HOLSTER BLD 10FT (ELECTRODE) ×3 IMPLANT
SLEEVE SCD COMPRESS KNEE MED (MISCELLANEOUS) ×3 IMPLANT
SPONGE LAP 18X18 X RAY DECT (DISPOSABLE) ×12 IMPLANT
STRIP SUTURE WOUND CLOSURE 1/2 (SUTURE) ×6 IMPLANT
SUT MNCRL AB 4-0 PS2 18 (SUTURE) ×6 IMPLANT
SUT MON AB 3-0 SH 27 (SUTURE) ×3
SUT MON AB 3-0 SH27 (SUTURE) ×1 IMPLANT
SUT MON AB 5-0 PS2 18 (SUTURE) ×12 IMPLANT
SUT PDS 3-0 CT2 (SUTURE)
SUT PDS AB 2-0 CT2 27 (SUTURE) IMPLANT
SUT PDS II 3-0 CT2 27 ABS (SUTURE) IMPLANT
SUT SILK 3 0 PS 1 (SUTURE) IMPLANT
SUT VIC AB 3-0 SH 27 (SUTURE)
SUT VIC AB 3-0 SH 27X BRD (SUTURE) IMPLANT
SUT VICRYL 4-0 PS2 18IN ABS (SUTURE) IMPLANT
SYR 3ML 23GX1 SAFETY (SYRINGE) ×3 IMPLANT
SYR 50ML LL SCALE MARK (SYRINGE) ×3 IMPLANT
SYR BULB IRRIGATION 50ML (SYRINGE) ×3 IMPLANT
SYR CONTROL 10ML LL (SYRINGE) ×3 IMPLANT
TAPE MEASURE VINYL STERILE (MISCELLANEOUS) IMPLANT
TOWEL OR 17X24 6PK STRL BLUE (TOWEL DISPOSABLE) ×9 IMPLANT
TUBE CONNECTING 20'X1/4 (TUBING) ×1
TUBE CONNECTING 20X1/4 (TUBING) ×2 IMPLANT
TUBING INFILTRATION IT-10001 (TUBING) ×3 IMPLANT
TUBING SET GRADUATE ASPIR 12FT (MISCELLANEOUS) ×3 IMPLANT
UNDERPAD 30X30 (UNDERPADS AND DIAPERS) ×6 IMPLANT
YANKAUER SUCT BULB TIP NO VENT (SUCTIONS) ×3 IMPLANT

## 2015-12-20 NOTE — Op Note (Signed)
Breast Reduction Op note:    DATE OF PROCEDURE: 12/20/2015  [facility]  SURGEON: Jennifer Ripperlaire Sanger Dillingham, DO  PREOPERATIVE DIAGNOSIS 1. Macromastia 2. Neck Pain 3. Back Pain  POSTOPERATIVE DIAGNOSIS 1. Macromastia 2. Neck Pain 3. Back Pain  PROCEDURES 1. Bilateral breast reduction.  Right reduction 615g, Left reduction 702g  COMPLICATIONS: None.  DRAINS: none   INDICATIONS FOR PROCEDURE @FNAMEA @ Jennifer Parks is a 25 y.o. year-old female born on 1991-04-15,with a history of symptomatic macromastia with concominant back pain, neck pain, shoulder grooving from her bra.   MRN: [id number]  CONSENT Informed consent was obtained directly from the patient. The risks, benefits and alternatives were fully discussed. Specific risks including but not limited to bleeding, infection, hematoma, seroma, scarring, pain, nipple necrosis, asymmetry, poor cosmetic results, and need for further surgery were discussed. The patient had ample opportunity to have her questions answered to her satisfaction.  DESCRIPTION OF PROCEDURE  Patient was brought into the operating room and placed in a supine position.  SCDs were placed and appropriate padding was performed.  Antibiotics were given. The patient underwent general anesthesia and the chest was prepped and draped in a sterile fashion.  A timeout was performed and all information was confirmed to be correct.  Right side: Preoperative markings were confirmed.  Incision lines were injected with 1% Xylocaine with epinephrine.  After waiting for vasoconstriction, the marked lines were incised.  A Wise-pattern superomedial breast reduction was performed by de-epithelializing the pedicle, using bovie to create the superomedial pedicle, and removing breast tissue from the superior, lateral, and inferior portions of the breast.  Care was taken to not undermine the breast pedicle. Hemostasis was achieved.  Tumenscent was placed at  the inferior medial and lateral aspect of the breast.  Liposuction was done for contouring.  The nipple was gently rotated into position and the soft tissue closed with 4-0 Monocryl.   The pocket was irrigated and hemostasis confirmed.  The deep tissues were approximated with 3-0 monocryl sutures and the skin was closed with deep dermal and subcuticular 4-0 Monocryl sutures.  The nipple and skin flaps had good capillary refill at the end of the procedure.    Left side: Preoperative markings were confirmed.  Incision lines were injected with 1% Xylocaine with epinephrine.  After waiting for vasoconstriction, the marked lines were incised.  A Wise-pattern superomedial breast reduction was performed by de-epithelializing the pedicle, using bovie to create the superomedial pedicle, and removing breast tissue from the superior, lateral, and inferior portions of the breast.  Care was taken to not undermine the breast pedicle. Hemostasis was achieved. Tumenscent was placed at the inferior medial and lateral aspect of the breast.  Liposuction was done for contouring.  The nipple was gently rotated into position and the soft tissue was closed with 4-0 Monocryl.  The patient was sat upright and size and shape symmetry was confirmed.  The pocket was irrigated and hemostasis confirmed.  The deep tissues were approximated with 3-0 monocryl sutures and the skin was closed with deep dermal and subcuticular 4-0 Monocryl sutures.  Dermabond was applied.  A breast binder and ABDs were placed.  The nipple and skin flaps had good capillary refill at the end of the procedure.  The patient tolerated the procedure well. The patient was allowed to wake from anesthesia and taken to the recovery room in satisfactory condition

## 2015-12-20 NOTE — Anesthesia Preprocedure Evaluation (Addendum)
Anesthesia Evaluation  Patient identified by MRN, date of birth, ID band Patient awake    Reviewed: Allergy & Precautions, H&P , NPO status , Patient's Chart, lab work & pertinent test results, reviewed documented beta blocker date and time   History of Anesthesia Complications Negative for: history of anesthetic complications  Airway Mallampati: II  TM Distance: >3 FB Neck ROM: full    Dental  (+) Teeth Intact   Pulmonary neg pulmonary ROS,    Pulmonary exam normal breath sounds clear to auscultation       Cardiovascular negative cardio ROS   Rhythm:regular Rate:Normal     Neuro/Psych  Headaches, PSYCHIATRIC DISORDERS Anxiety Depression    GI/Hepatic negative GI ROS, Neg liver ROS,   Endo/Other  negative endocrine ROS  Renal/GU ARFRenal disease (Cr 5.7, BUN 53, K 5.1) Bladder dysfunction (suspected bladder injury) Female GU complaint (laparoscopy 2 days ago for endometriosis)     Musculoskeletal   Abdominal   Peds  Hematology negative hematology ROS (+)   Anesthesia Other Findings NPO since 10 am  Reproductive/Obstetrics negative OB ROS                             Anesthesia Physical  Anesthesia Plan  ASA: I  Anesthesia Plan: General   Post-op Pain Management:    Induction: Intravenous  Airway Management Planned: Oral ETT  Additional Equipment:   Intra-op Plan:   Post-operative Plan: Extubation in OR  Informed Consent: I have reviewed the patients History and Physical, chart, labs and discussed the procedure including the risks, benefits and alternatives for the proposed anesthesia with the patient or authorized representative who has indicated his/her understanding and acceptance.   Dental advisory given  Plan Discussed with: CRNA  Anesthesia Plan Comments:        Anesthesia Quick Evaluation

## 2015-12-20 NOTE — Anesthesia Postprocedure Evaluation (Signed)
Anesthesia Post Note  Patient: Jennifer Parks  Procedure(s) Performed: Procedure(s) (LRB): BILATERAL BREAST REDUCTION AND LIPOSUCTION (Bilateral)  Patient location during evaluation: PACU Anesthesia Type: General Level of consciousness: sedated and patient cooperative Pain management: pain level controlled Vital Signs Assessment: post-procedure vital signs reviewed and stable Respiratory status: spontaneous breathing Cardiovascular status: stable Anesthetic complications: no    Last Vitals:  Filed Vitals:   12/20/15 1330 12/20/15 1400  BP: 125/74   Pulse: 98 100  Temp:  36.8 C  Resp: 16     Last Pain:  Filed Vitals:   12/20/15 1405  PainSc: 2                  Lewie LoronJohn Suetta Hoffmeister

## 2015-12-20 NOTE — Interval H&P Note (Signed)
History and Physical Interval Note:  12/20/2015 8:15 AM  Jennifer Parks  has presented today for surgery, with the diagnosis of BREAST HYPERTROPHY  The various methods of treatment have been discussed with the patient and family. After consideration of risks, benefits and other options for treatment, the patient has consented to  Procedure(s): BILATERAL BREAST REDUCTION   (Bilateral) as a surgical intervention .  The patient's history has been reviewed, patient examined, no change in status, stable for surgery.  I have reviewed the patient's chart and labs.  Questions were answered to the patient's satisfaction.     [name]

## 2015-12-20 NOTE — H&P (View-Only) (Signed)
Jennifer Parks is an 25 y.o. female.   Chief Complaint: mammary hypertrophy HPI: The patient is a 25 yo female here history and physical prior to bilateral breast reduction surgery.  History:  She has extremely large breasts causing symptoms that include the following: Back pain (upper and lower) and neck pain. She frequently pins bra cups higher on straps for better lift and relief. Notices relief when holding breast up in her hands. Shoulder straps causing grooves, pain occasionally requiring padding. Pain medication is sometimes required with motrin and tylenol.  Her breasts are extremely large and fairly symmetric. She has hyperpigmentation of the inframammary area on both sides. The sternal to nipple distance on the right is 25 and the left is 26. She is 5 feet 3 inches tall and weighs 160 pounds. Preoperative bra size = 34G cup. The estimated excess breast tissue to be removed at the time of surgery is 420 grams on the left and 420 grams on the right. There is no known family history of breast cancer. She had an accidental puncture of the bladder during a laparoscopic surgery for evaluation for endometriosis.  Past Medical History  Diagnosis Date  . Acne   . Depression   . Anxiety   . Headache     takes ibuprofen    Past Surgical History  Procedure Laterality Date  . Wisdom tooth extraction    . Laparoscopy N/A 11/09/2013    Procedure: LAPAROSCOPY OPERATIVE WITH FULGERATION OF ENDOMETRIOSIS;  Surgeon: Zelphia Cairo, MD;  Location: [facility];  Service: Gynecology;  Laterality: N/A;  . Cystoscopy N/A 11/11/2013    Procedure: CYSTOSCOPY;  Surgeon: Zelphia Cairo, MD;  Location: [facility];  Service: Gynecology;  Laterality: N/A;  . Laparotomy  11/11/2013    Procedure: MINI LAPAROTOMY;  Surgeon: Zelphia Cairo, MD;  Location: [facility];  Service: Gynecology;;    Family History  Problem Relation Age of Onset  . Mental illness Mother   . Hyperlipidemia Father   . Lung cancer Maternal  Grandfather   . COPD Maternal Grandfather   . Mental illness Paternal Grandmother   . COPD Paternal Grandmother   . Hyperlipidemia Paternal Grandfather   . Heart disease Paternal Grandfather    Social History:  reports that she has never smoked. She has never used smokeless tobacco. She reports that she drinks alcohol. She reports that she does not use illicit drugs.  Allergies:  Allergies  Allergen Reactions  . Amoxicillin Anaphylaxis and Rash  . Penicillins Anaphylaxis    Pt states allergy is to all penicillins      (Not in a hospital admission)  No results found for this or any previous visit (from the past 48 hour(s)). No results found.  Review of Systems  Constitutional: Negative.   HENT: Negative.   Eyes: Negative.   Respiratory: Negative.   Cardiovascular: Negative.   Gastrointestinal: Negative.   Genitourinary: Negative.   Musculoskeletal: Negative.   Skin: Negative.   Neurological: Negative.   Psychiatric/Behavioral: Negative.     Last menstrual period 12/13/2015. Physical Exam  Constitutional: She is oriented to person, place, and time. She appears well-developed and well-nourished.  HENT:  Head: Normocephalic and atraumatic.  Eyes: EOM are normal. Pupils are equal, round, and reactive to light.  Cardiovascular: Normal rate.   Respiratory: Effort normal.  GI: Soft. She exhibits no distension. There is no tenderness.  Musculoskeletal: Normal range of motion.  Neurological: She is alert and oriented to person, place, and time.  Skin: Skin is  warm.  Psychiatric: She has a normal mood and affect. Her behavior is normal. Judgment and thought content normal.     Assessment/Plan Plan for bilateral breast reduction with possible liposuction.   Peggye FormCLAIRE S DILLINGHAM, DO 12/15/2015, 11:28 AM

## 2015-12-20 NOTE — Discharge Instructions (Signed)
May shower tomorrow Continue binder or sports bra No heavy lifting Drink water   Call your surgeon if you experience:   1.  Fever over 101.0. 2.  Inability to urinate. 3.  Nausea and/or vomiting. 4.  Extreme swelling or bruising at the surgical site. 5.  Continued bleeding from the incision. 6.  Increased pain, redness or drainage from the incision. 7.  Problems related to your pain medication. 8.  Any problems and/or concerns      Post Anesthesia Home Care Instructions  Activity: Get plenty of rest for the remainder of the day. A responsible adult should stay with you for 24 hours following the procedure.  For the next 24 hours, DO NOT: -Drive a car -Advertising copywriterperate machinery -Drink alcoholic beverages -Take any medication unless instructed by your physician -Make any legal decisions or sign important papers.  Meals: Start with liquid foods such as gelatin or soup. Progress to regular foods as tolerated. Avoid greasy, spicy, heavy foods. If nausea and/or vomiting occur, drink only clear liquids until the nausea and/or vomiting subsides. Call your physician if vomiting continues.  Special Instructions/Symptoms: Your throat may feel dry or sore from the anesthesia or the breathing tube placed in your throat during surgery. If this causes discomfort, gargle with warm salt water. The discomfort should disappear within 24 hours.  If you had a scopolamine patch placed behind your ear for the management of post- operative nausea and/or vomiting:  1. The medication in the patch is effective for 72 hours, after which it should be removed.  Wrap patch in a tissue and discard in the trash. Wash hands thoroughly with soap and water. 2. You may remove the patch earlier than 72 hours if you experience unpleasant side effects which may include dry mouth, dizziness or visual disturbances. 3. Avoid touching the patch. Wash your hands with soap and water after contact with the patch.

## 2015-12-20 NOTE — Anesthesia Procedure Notes (Signed)
Procedure Name: Intubation Date/Time: 12/20/2015 9:46 AM Performed by: Zenia ResidesPAYNE, Mieka Leaton D Pre-anesthesia Checklist: Patient identified, Emergency Drugs available, Suction available and Patient being monitored Patient Re-evaluated:Patient Re-evaluated prior to inductionOxygen Delivery Method: Circle System Utilized Preoxygenation: Pre-oxygenation with 100% oxygen Intubation Type: IV induction Ventilation: Mask ventilation without difficulty Laryngoscope Size: Mac and 3 Grade View: Grade I Tube type: Oral Tube size: 7.0 mm Number of attempts: 1 Airway Equipment and Method: Stylet and Oral airway Placement Confirmation: ETT inserted through vocal cords under direct vision,  positive ETCO2 and breath sounds checked- equal and bilateral Secured at: 23 cm Tube secured with: Tape Dental Injury: Teeth and Oropharynx as per pre-operative assessment

## 2015-12-20 NOTE — Transfer of Care (Signed)
Immediate Anesthesia Transfer of Care Note  Patient: Jennifer Parks  Procedure(s) Performed: Procedure(s): BILATERAL BREAST REDUCTION AND LIPOSUCTION (Bilateral)  Patient Location: PACU  Anesthesia Type:General  Level of Consciousness: awake, alert  and oriented  Airway & Oxygen Therapy: Patient Spontanous Breathing and Patient connected to face mask oxygen  Post-op Assessment: Report given to RN and Post -op Vital signs reviewed and stable  Post vital signs: Reviewed and stable  Last Vitals:  Filed Vitals:   12/20/15 0752  BP: 122/73  Pulse: 80  Temp: 36.7 C  Resp: 18    Last Pain: There were no vitals filed for this visit.    Patients Stated Pain Goal: 0 (12/20/15 0752)  Complications: No apparent anesthesia complications

## 2015-12-20 NOTE — Brief Op Note (Signed)
12/20/2015  12:25 PM  PATIENT:  Jennifer Parks  25 y.o. female  PRE-OPERATIVE DIAGNOSIS:  BREAST HYPERTROPHY  POST-OPERATIVE DIAGNOSIS:  BREAST HYPERTROPHY  PROCEDURE:  Procedure(s): BILATERAL BREAST REDUCTION AND LIPOSUCTION (Bilateral)  SURGEON:  Surgeon(s) and Role:    * Lakiya Cottam S Kobee Medlen, DO - Primary  PHYSICIAN ASSISTANT: Shawn Rayburn, PA  ASSISTANTS: none   ANESTHESIA:   general  EBL:  Total I/O In: 1000 [I.V.:1000] Out: -   BLOOD ADMINISTERED:none  DRAINS: none   LOCAL MEDICATIONS USED:  MARCAINE    and LIDOCAINE   SPECIMEN:  Source of Specimen:  breast tissue bilaterally  DISPOSITION OF SPECIMEN:  PATHOLOGY  COUNTS:  YES  TOURNIQUET:  * No tourniquets in log *  DICTATION: .Dragon Dictation  PLAN OF CARE: Discharge to home after PACU  PATIENT DISPOSITION:  PACU - hemodynamically stable.   Delay start of Pharmacological VTE agent (>24hrs) due to surgical blood loss or risk of bleeding: no

## 2015-12-21 ENCOUNTER — Encounter (HOSPITAL_BASED_OUTPATIENT_CLINIC_OR_DEPARTMENT_OTHER): Payer: Self-pay | Admitting: Plastic Surgery

## 2015-12-21 NOTE — Addendum Note (Signed)
Addendum  created 12/21/15 0706 by Jewel Baizeimothy D Byron Tipping, CRNA   Modules edited: Charges VN

## 2015-12-26 ENCOUNTER — Ambulatory Visit: Payer: 59 | Admitting: *Deleted

## 2015-12-28 DIAGNOSIS — F322 Major depressive disorder, single episode, severe without psychotic features: Secondary | ICD-10-CM | POA: Diagnosis not present

## 2016-01-02 ENCOUNTER — Ambulatory Visit: Payer: 59 | Admitting: *Deleted

## 2016-01-04 ENCOUNTER — Encounter: Payer: 59 | Attending: Internal Medicine | Admitting: *Deleted

## 2016-01-04 ENCOUNTER — Encounter: Payer: Self-pay | Admitting: *Deleted

## 2016-01-04 DIAGNOSIS — Z713 Dietary counseling and surveillance: Secondary | ICD-10-CM | POA: Diagnosis not present

## 2016-01-04 DIAGNOSIS — F509 Eating disorder, unspecified: Secondary | ICD-10-CM

## 2016-01-04 NOTE — Patient Instructions (Signed)
Please rate hunger and fullness cues before and after eating  Consider reading: Life without Ed by Geroge BasemanJenni Schafer Embody by William Daltononnie Sobczak    Please continue excellent work with eating!!!!! Aim for 3 meals and snacks, as needed Stay hydrated Don't exercise too much.  Walking is safe and effective.

## 2016-01-04 NOTE — Progress Notes (Signed)
Appointment start time: 1400  Appointment end time: 1500  Patient was seen on 01/04/16 for nutrition counseling pertaining to disordered eating  Primary care provider: Dr. Lawerance BachBurns Therapist: Lewie LoronBurwell Anthony dical team members: none   Assessment:   Graduated and got a job in [place].  Is excited about moving out!  She has a roommate that she went to school with.  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   She will be moving in about a month.  She just had breast reduction surgery and can't lift anything heavy so her exercise is limited.  She has not been able to cook since her surgery.  She tried to cook and was in a lot of pain.  She feels like she hasn't been eating "as great as I'd like."  She hasn't felt as guilty as before.  She doesn't think she is as big due to her breast reduction.  She ate a hot dog and didn't feel too guilty She's very happy with her results and is much happier with her body.  Work with therapist twice/month  Is concerned about what she's currently eating since she can't cook.  Moving out she won't be as nervous until her job start.  She is ok with eating leftovers.  She will get off around 3:30 so should have time to cook.  Currently parents fix easy things.  They grill a lot: burger, salmon, chicken.  It's bland and she doesn't like it.  They might buy iceberg lettuce.  She doesn't ant to eat their food and then she will make PB and J. She doesn't feel guilty   They eat out twice weekly: [place] ([employer]), [employer] (grilled cheese and soup or pasta or sometimes a sandwich).  She is a slow eater. She tends to finish her food.  She feel stuffed afterwards  Mental health diagnosis: doesn't know her diagnosis; possibly EDNOS/OSFED   Dietary assessment: A typical day consists of 3 meals and 1-2 snacks  Safe foods include: cereal, salads, sandwiches, cooking light recipes, vegetables, meat Avoided foods include: pasta, sweets (ice cream, cookies,  cakes) Binge foods: mac-n-cheese, cookies  24 hour recall:  B: sausage biscuit L: mcdonald's fish filet, sweet tea, few fries S: none D: stamey's BBQ sandwich, hushpuppies, cookies, tea  B: special k and coffee L: hotdog, ice cream, root beer (yum yum)   No compensatory methods; it's kinda nice not worrying about it.     What Methods Do You Use To Control Your Weight (Compensatory behaviors)? None reported   Estimated energy intake: 1800-1900 kcal  Estimated energy needs: 2200 kcal 275 g CHO 110 g pro 73 g fat  Nutrition Diagnosis: NB-1.5 Disordered eating pattern As related to restriction and binge.  As evidenced by eating disorder.  Intervention/Goals: Nutrition counseling provided.  Praised progress and reiterated positive messages about food, health, and body image.  Discussed intuitive eating and laid foundation for eating the amount her body needs, not a prescribed portion.  Advised rating internal hunger and fullness using scale  Books to read this summer: Life without Ed by Geroge BasemanJenni Schafer Embody by William Daltononnie Sobczak   Monitoring and Evaluation: Patient will follow up in ~ 2 months per her request

## 2016-01-11 DIAGNOSIS — F322 Major depressive disorder, single episode, severe without psychotic features: Secondary | ICD-10-CM | POA: Diagnosis not present

## 2016-01-16 MED FILL — CLINDAMYCIN PH 1% GEL: 1 | 30 days supply | Qty: 60 | Fill #4

## 2016-01-25 DIAGNOSIS — F322 Major depressive disorder, single episode, severe without psychotic features: Secondary | ICD-10-CM | POA: Diagnosis not present

## 2016-02-02 MED FILL — CEPHALEXIN 500 MG CAPSULE: 500 | 30 days supply | Qty: 60 | Fill #2

## 2016-02-15 MED FILL — ADZENYS XR-ODT 6.3 MG TAB: 6.3 | 30 days supply | Qty: 30 | Fill #0

## 2016-03-06 MED FILL — NUVARING VAGINAL RING: 0.12-0.015 | 84 days supply | Qty: 3 | Fill #0

## 2016-03-06 MED FILL — CLINDAMYCIN PH 1% GEL: 1 | 30 days supply | Qty: 60 | Fill #5

## 2016-03-14 DIAGNOSIS — F322 Major depressive disorder, single episode, severe without psychotic features: Secondary | ICD-10-CM | POA: Diagnosis not present

## 2016-03-15 ENCOUNTER — Encounter: Payer: 59 | Attending: Internal Medicine | Admitting: *Deleted

## 2016-03-15 DIAGNOSIS — Z713 Dietary counseling and surveillance: Secondary | ICD-10-CM | POA: Insufficient documentation

## 2016-03-15 DIAGNOSIS — F509 Eating disorder, unspecified: Secondary | ICD-10-CM

## 2016-03-15 NOTE — Patient Instructions (Addendum)
Consider talking with your doctor about increasing Prozac Consider signing up for [facility] group Call treatment team providers in [place]  Shawna Orleans Ladona Ridgel and Orest Dikes) Increase protein at breakfast Consider variety with your lunches and dinner: Eat a snack if you want it  Watch Embrace on Netflix You deserve to eat what your body needs.  What would it look like if you treated yourself with the same kindness as your dog  Body Positive Resources Http://[email or web address].com/p/resources.html

## 2016-03-15 NOTE — Progress Notes (Signed)
Appointment start time: 0800  Appointment end time: 0900  Patient was seen on 03/15/16 for nutrition counseling pertaining to disordered eating  Primary care provider: Dr. Lawerance Bach Therapist: Lewie Loron   Assessment:   Work starts soon.  She is looking forward to starting.  She is exploring [place] and really likes it.  Monday was her birthday and she had a good time  States she isn't doing "so well" with her eating.  She goes out to eat and that is fine.  But she has time to sit at home with her thoughts and thinks she is doing well with eating She actually felt nauseated over her anxiety about food.  Guilt is worse.   Has not seen her therapist in over a month.  Realizes she needs a more local therapist.  Started reading Life Without Ed and is at the beginning.  That is stressful because it rings true.  She is glad that she is reading it.     Feels like meals are larger and feels guilty about that (meals are actually not excessive) feeling nauseated regularly, .  Related to anxiety; Bad relflux on occasion with extra anxiety No constipated  No dizziness or lightheadedness Not as energetic.  Thinks this is from boredom Hard time falling asleep and, but does sleep steady 9-10 hours    Mental health diagnosis: doesn't know her diagnosis; possibly EDNOS/OSFED   Dietary assessment: A typical day consists of 3 meals and not many snacks  Safe foods include: cereal, salads, sandwiches, cooking light recipes, vegetables, meat Avoided foods include: pasta, sweets (ice cream, cookies, cakes) Binge foods: mac-n-cheese, cookies  24 hour recall:  B: homemade breakfast cookie (with oatmeal, PB, honey) L: pasta leftovers (2 cups) and pound cake S: 2-3 cookies D: grilled chicken legs, watermelon Couple glasses wine Beverages: water, milk with meals  Felt this was too much (Pasta and pound cake.) Felt nauseated    Typical day B: breakfast cookie and cereal L: quinoa with chicken  and stir fry veggies S: cheese or goldfish D: same  Physical activity: not much; walk her dog  What Methods Do You Use To Control Your Weight (Compensatory behaviors)? "watch what I eat" and then feel bad   Estimated energy intake: 1200 kcal  Estimated energy needs: 2200 kcal 275 g CHO 110 g pro 73 g fat  Nutrition Diagnosis: NB-1.5 Disordered eating pattern As related to restriction and binge.  As evidenced by eating disorder.  Intervention/Goals: Nutrition counseling provided. Recommended therapist and dietitian in [place].  Recommended [facility] group and various [employer] resources.  Suggested talking with provider about increasing Prozac.  She was agreeable to all recommendations.  Listened and affirmed.  Challenge cognitive distortions and asserted she deserves to eat and eat enough  Monitoring and Evaluation: Patient will follow up in 3 weeks per her request

## 2016-03-24 ENCOUNTER — Telehealth: Payer: 59 | Admitting: Nurse Practitioner

## 2016-03-24 DIAGNOSIS — R05 Cough: Secondary | ICD-10-CM

## 2016-03-24 DIAGNOSIS — R059 Cough, unspecified: Secondary | ICD-10-CM

## 2016-03-24 MED ORDER — AZITHROMYCIN 250 MG PO TABS: ORAL_TABLET | ORAL | 0 refills | Status: DC

## 2016-03-24 NOTE — Progress Notes (Signed)

## 2016-03-28 ENCOUNTER — Telehealth: Payer: Self-pay

## 2016-03-28 ENCOUNTER — Ambulatory Visit (INDEPENDENT_AMBULATORY_CARE_PROVIDER_SITE_OTHER): Payer: 59 | Admitting: Nurse Practitioner

## 2016-03-28 ENCOUNTER — Encounter: Payer: Self-pay | Admitting: Nurse Practitioner

## 2016-03-28 ENCOUNTER — Other Ambulatory Visit (INDEPENDENT_AMBULATORY_CARE_PROVIDER_SITE_OTHER): Payer: 59

## 2016-03-28 VITALS — BP 118/68 | HR 84 | Temp 98.3°F

## 2016-03-28 DIAGNOSIS — M6249 Contracture of muscle, multiple sites: Secondary | ICD-10-CM | POA: Diagnosis not present

## 2016-03-28 DIAGNOSIS — M62838 Other muscle spasm: Secondary | ICD-10-CM

## 2016-03-28 DIAGNOSIS — J029 Acute pharyngitis, unspecified: Secondary | ICD-10-CM | POA: Diagnosis not present

## 2016-03-28 DIAGNOSIS — B2790 Infectious mononucleosis, unspecified without complication: Secondary | ICD-10-CM | POA: Diagnosis not present

## 2016-03-28 DIAGNOSIS — R5381 Other malaise: Secondary | ICD-10-CM | POA: Diagnosis not present

## 2016-03-28 DIAGNOSIS — R5383 Other fatigue: Secondary | ICD-10-CM

## 2016-03-28 LAB — CBC WITH DIFFERENTIAL/PLATELET
BASOS ABS: 0 10*3/uL (ref 0.0–0.1)
Basophils Relative: 0.6 % (ref 0.0–3.0)
EOS ABS: 0 10*3/uL (ref 0.0–0.7)
EOS PCT: 0.5 % (ref 0.0–5.0)
HCT: 38.8 % (ref 36.0–46.0)
HEMOGLOBIN: 13.5 g/dL (ref 12.0–15.0)
Lymphocytes Relative: 25.8 % (ref 12.0–46.0)
Lymphs Abs: 1.4 10*3/uL (ref 0.7–4.0)
MCHC: 34.7 g/dL (ref 30.0–36.0)
MCV: 86.1 fl (ref 78.0–100.0)
MONO ABS: 0.7 10*3/uL (ref 0.1–1.0)
Monocytes Relative: 13.9 % — ABNORMAL HIGH (ref 3.0–12.0)
Neutro Abs: 3.1 10*3/uL (ref 1.4–7.7)
Neutrophils Relative %: 59.2 % (ref 43.0–77.0)
Platelets: 245 10*3/uL (ref 150.0–400.0)
RBC: 4.51 Mil/uL (ref 3.87–5.11)
RDW: 13 % (ref 11.5–15.5)
WBC: 5.3 10*3/uL (ref 4.0–10.5)

## 2016-03-28 LAB — MONONUCLEOSIS SCREEN: MONO SCREEN: POSITIVE — AB

## 2016-03-28 LAB — POCT RAPID STREP A (OFFICE): RAPID STREP A SCREEN: NEGATIVE

## 2016-03-28 MED ORDER — CYCLOBENZAPRINE HCL 5 MG PO TABS: 5.0000 mg | ORAL_TABLET | Freq: Every evening | ORAL | 1 refills | Status: DC | PRN

## 2016-03-28 MED FILL — CYCLOBENZAPRINE 5 MG TABLET: 5 | 14 days supply | Qty: 14 | Fill #0

## 2016-03-28 NOTE — Patient Instructions (Addendum)
Encourage adequate oral hydration. May continue tylenol or ibuprofen for pain Use flexeril at bedtime for neck stiffness. May also use warm compress as needed for muscle spasm.  Normal CBC. Positive mono test which will explain current symptoms. This is a viral infection, hence only symptoms are treated with use of tylenol or ibuprofen, oral hydration, rest and adequate food intake. Maintain normal activity level Symptoms may take up to 4weeks to resolve. No additional oral abx needed at this time.

## 2016-03-28 NOTE — Progress Notes (Signed)
Subjective:  Patient ID: Jennifer Parks, female    DOB: 1991-04-01  Age: 25 y.o. MRN: [id number]  CC: Sore Throat (x 2weeks) and Muscle Pain   Sore Throat   This is a new problem. The current episode started 1 to 4 weeks ago ((2weeks ago)). The problem has been unchanged. There has been no fever. The pain is at a severity of 8/10. The pain is severe. Associated symptoms include congestion, headaches, neck pain, swollen glands and trouble swallowing. Pertinent negatives include no abdominal pain, coughing, diarrhea, drooling, ear pain, hoarse voice, plugged ear sensation, shortness of breath, stridor or vomiting. She has had no exposure to strep or mono. She has tried acetaminophen and NSAIDs (and azithromycin) for the symptoms. The treatment provided mild relief.  Muscle Pain  This is a new problem. The current episode started 1 to 4 weeks ago. The problem occurs intermittently. The problem is unchanged (worse in morning). The pain occurs in the context of a recent illness. The pain is present in the neck, right shoulder and left shoulder. The pain is medium. Associated symptoms include fatigue, headaches, stiffness and swollen glands. Pertinent negatives include no abdominal pain, chest pain, diarrhea, fever, joint swelling, nausea, rash, sensory change, shortness of breath, visual change, vomiting or weakness. Past treatments include acetaminophen and OTC NSAID (and azithromycin). There is no swelling present. She has been behaving normally. There is no history of chronic back pain, rheumatic disease or sickle cell disease.     Outpatient Medications Prior to Visit  Medication Sig Dispense Refill  . Ascorbic Acid (VITAMIN C) 100 MG tablet Take 100 mg by mouth daily.    . Cyanocobalamin (VITAMIN B 12 PO) Take by mouth.    Marland Kitchen. FLUoxetine (PROZAC) 20 MG capsule Take 40 mg by mouth.    Marland Kitchen. FLUoxetine (PROZAC) 40 MG capsule Take 1 capsule (40 mg total) by mouth daily. 90 capsule 1  .  HYDROcodone-acetaminophen (NORCO/VICODIN) 5-325 MG tablet Take by mouth.    . Multiple Vitamin (MULTIVITAMIN WITH MINERALS) TABS tablet Take 1 tablet by mouth daily. Reported on 10/13/2015    . norethindrone-ethinyl estradiol (JUNEL FE,GILDESS FE,LOESTRIN FE) 1-20 MG-MCG tablet Take 1 tablet by mouth daily.    Marland Kitchen. azithromycin (ZITHROMAX Z-PAK) 250 MG tablet As directed 6 tablet 0  . cephALEXin (KEFLEX) 500 MG capsule Take 500 mg by mouth once.     Facility-Administered Medications Prior to Visit  Medication Dose Route Frequency Provider Last Rate Last Dose  . tetanus & diphtheria toxoids (adult) (TENIVAC) injection 0.5 mL  0.5 mL Intramuscular Once Newt LukesValerie A Leschber, MD        ROS Review of Systems  Constitutional: Positive for fatigue. Negative for fever.  HENT: Positive for congestion and trouble swallowing. Negative for drooling, ear pain and hoarse voice.   Respiratory: Negative for cough, shortness of breath and stridor.   Cardiovascular: Negative for chest pain.  Gastrointestinal: Negative for abdominal pain, diarrhea, nausea and vomiting.  Musculoskeletal: Positive for neck pain and stiffness. Negative for joint swelling.  Skin: Negative for rash.  Neurological: Positive for headaches. Negative for sensory change and weakness.    Objective:  BP 118/68   Pulse 84   Temp 98.3 F (36.8 C)   SpO2 98%   BP Readings from Last 3 Encounters:  03/28/16 118/68  12/20/15 125/74  12/11/15 116/84    Wt Readings from Last 3 Encounters:  12/20/15 161 lb (73 kg)  12/11/15 162 lb (73.5 kg)  11/21/15 160  lb (72.6 kg)    Physical Exam  Constitutional: She is oriented to person, place, and time. She appears well-developed. No distress.  HENT:  Head: Normocephalic.  Right Ear: Tympanic membrane and ear canal normal.  Left Ear: Tympanic membrane and ear canal normal.  Nose: Mucosal edema present. No rhinorrhea. Right sinus exhibits frontal sinus tenderness. Left sinus exhibits frontal  sinus tenderness.  Mouth/Throat: Uvula is midline. No trismus in the jaw. Posterior oropharyngeal erythema present. No oropharyngeal exudate or posterior oropharyngeal edema.  Eyes: No scleral icterus.  Neck: Normal range of motion. Neck supple. Muscular tenderness present. No spinous process tenderness present. No neck rigidity. No erythema and normal range of motion present. No Brudzinski's sign and no Kernig's sign noted. No thyroid mass and no thyromegaly present.  Pulmonary/Chest: No stridor.  Abdominal: Soft. Bowel sounds are normal. She exhibits no distension and no mass. There is no guarding.  Musculoskeletal: Normal range of motion. She exhibits no edema.  Lymphadenopathy:    She has cervical adenopathy.  Neurological: She is alert and oriented to person, place, and time.  Skin: Skin is warm and dry.  Vitals reviewed.   Lab Results  Component Value Date   WBC 5.3 03/28/2016   HGB 13.5 03/28/2016   HCT 38.8 03/28/2016   PLT 245.0 03/28/2016   GLUCOSE 78 03/08/2014   CHOL 167 03/08/2014   TRIG 96.0 03/08/2014   HDL 54.30 03/08/2014   LDLCALC 94 03/08/2014   ALT 13 03/08/2014   AST 22 03/08/2014   NA 137 03/08/2014   K 4.0 03/08/2014   CL 103 03/08/2014   CREATININE 0.8 03/08/2014   BUN 11 03/08/2014   CO2 31 03/08/2014   TSH 0.95 03/08/2014    No results found.  Assessment & Plan:   Jennifer Parks was seen today for sore throat and muscle pain.  Diagnoses and all orders for this visit:  Acute pharyngitis due to infectious mononucleosis -     POCT rapid strep A -     Monospot; Future -     CBC w/Diff; Future  Malaise and fatigue -     POCT rapid strep A -     Monospot; Future -     CBC w/Diff; Future  Cervical paraspinal muscle spasm -     cyclobenzaprine (FLEXERIL) 5 MG tablet; Take 1 tablet (5 mg total) by mouth at bedtime as needed for muscle spasms.   I have discontinued Jennifer Parks's cephALEXin and azithromycin. I am also having her start on  cyclobenzaprine. Additionally, I am having her maintain her multivitamin with minerals, norethindrone-ethinyl estradiol, FLUoxetine, HYDROcodone-acetaminophen, FLUoxetine, vitamin C, and Cyanocobalamin (VITAMIN B 12 PO). We will continue to administer tetanus & diphtheria toxoids (adult).  Meds ordered this encounter  Medications  . cyclobenzaprine (FLEXERIL) 5 MG tablet    Sig: Take 1 tablet (5 mg total) by mouth at bedtime as needed for muscle spasms.    Dispense:  14 tablet    Refill:  1    Order Specific Question:   Supervising Provider    Answer:   Tresa GarterPLOTNIKOV, ALEKSEI V [[id number]]     Follow-up: Return if symptoms worsen or fail to improve.  Alysia Pennaharlotte Jalynne Persico, NP

## 2016-03-28 NOTE — Telephone Encounter (Signed)
Advised patient of positive result for mono and gave all instructions from charlotte to patient---call us back if she needs note for work or has any further questions

## 2016-04-04 ENCOUNTER — Other Ambulatory Visit (INDEPENDENT_AMBULATORY_CARE_PROVIDER_SITE_OTHER): Payer: 59

## 2016-04-04 ENCOUNTER — Telehealth: Payer: Self-pay | Admitting: Internal Medicine

## 2016-04-04 ENCOUNTER — Inpatient Hospital Stay: Admission: RE | Admit: 2016-04-04 | Payer: 59 | Source: Ambulatory Visit

## 2016-04-04 ENCOUNTER — Encounter: Payer: Self-pay | Admitting: Internal Medicine

## 2016-04-04 ENCOUNTER — Telehealth: Payer: Self-pay | Admitting: Emergency Medicine

## 2016-04-04 ENCOUNTER — Ambulatory Visit: Payer: 59 | Admitting: *Deleted

## 2016-04-04 ENCOUNTER — Ambulatory Visit (INDEPENDENT_AMBULATORY_CARE_PROVIDER_SITE_OTHER): Payer: 59 | Admitting: Internal Medicine

## 2016-04-04 VITALS — BP 122/70 | HR 86 | Temp 98.3°F | Resp 20 | Wt 166.0 lb

## 2016-04-04 DIAGNOSIS — L299 Pruritus, unspecified: Secondary | ICD-10-CM

## 2016-04-04 DIAGNOSIS — D72829 Elevated white blood cell count, unspecified: Secondary | ICD-10-CM

## 2016-04-04 DIAGNOSIS — J028 Acute pharyngitis due to other specified organisms: Secondary | ICD-10-CM | POA: Diagnosis not present

## 2016-04-04 DIAGNOSIS — J029 Acute pharyngitis, unspecified: Secondary | ICD-10-CM

## 2016-04-04 DIAGNOSIS — F419 Anxiety disorder, unspecified: Secondary | ICD-10-CM

## 2016-04-04 LAB — CBC WITH DIFFERENTIAL/PLATELET
BASOS PCT: 0.3 % (ref 0.0–3.0)
Basophils Absolute: 0.1 10*3/uL (ref 0.0–0.1)
EOS PCT: 0.1 % (ref 0.0–5.0)
Eosinophils Absolute: 0 10*3/uL (ref 0.0–0.7)
HEMATOCRIT: 41 % (ref 36.0–46.0)
Hemoglobin: 14.1 g/dL (ref 12.0–15.0)
LYMPHS ABS: 11.7 10*3/uL — AB (ref 0.7–4.0)
Lymphocytes Relative: 64 % — ABNORMAL HIGH (ref 12.0–46.0)
MCHC: 34.3 g/dL (ref 30.0–36.0)
MCV: 86.5 fl (ref 78.0–100.0)
MONO ABS: 2.5 10*3/uL — AB (ref 0.1–1.0)
Monocytes Relative: 13.9 % — ABNORMAL HIGH (ref 3.0–12.0)
NEUTROS PCT: 21.7 % — AB (ref 43.0–77.0)
Neutro Abs: 4 10*3/uL (ref 1.4–7.7)
PLATELETS: 231 10*3/uL (ref 150.0–400.0)
RBC: 4.74 Mil/uL (ref 3.87–5.11)
RDW: 13.5 % (ref 11.5–15.5)
WBC: 18.3 10*3/uL (ref 4.0–10.5)

## 2016-04-04 LAB — URINALYSIS, ROUTINE W REFLEX MICROSCOPIC
HGB URINE DIPSTICK: NEGATIVE
KETONES UR: NEGATIVE
NITRITE: NEGATIVE
Specific Gravity, Urine: 1.02 (ref 1.000–1.030)
TOTAL PROTEIN, URINE-UPE24: NEGATIVE
URINE GLUCOSE: NEGATIVE
Urobilinogen, UA: 1 (ref 0.0–1.0)
pH: 6 (ref 5.0–8.0)

## 2016-04-04 LAB — HEPATIC FUNCTION PANEL
ALK PHOS: 458 U/L — AB (ref 39–117)
ALT: 263 U/L — ABNORMAL HIGH (ref 0–35)
AST: 185 U/L — AB (ref 0–37)
Albumin: 3.7 g/dL (ref 3.5–5.2)
BILIRUBIN DIRECT: 1 mg/dL — AB (ref 0.0–0.3)
Total Bilirubin: 1.4 mg/dL — ABNORMAL HIGH (ref 0.2–1.2)
Total Protein: 6.9 g/dL (ref 6.0–8.3)

## 2016-04-04 LAB — BASIC METABOLIC PANEL
BUN: 5 mg/dL — AB (ref 6–23)
CALCIUM: 8.6 mg/dL (ref 8.4–10.5)
CHLORIDE: 103 meq/L (ref 96–112)
CO2: 28 meq/L (ref 19–32)
CREATININE: 0.59 mg/dL (ref 0.40–1.20)
GFR: 133.02 mL/min (ref >60.00)
Glucose, Bld: 84 mg/dL (ref 70–99)
Potassium: 3.8 mEq/L (ref 3.5–5.1)
Sodium: 136 mEq/L (ref 135–145)

## 2016-04-04 MED ORDER — METHYLPREDNISOLONE ACETATE 80 MG/ML IJ SUSP
80.0000 mg | Freq: Once | INTRAMUSCULAR | Status: AC
  Administered 2016-04-04: 80 mg via INTRAMUSCULAR

## 2016-04-04 MED ORDER — LEVOFLOXACIN 500 MG PO TABS
500.0000 mg | ORAL_TABLET | Freq: Every day | ORAL | 0 refills | Status: AC
Start: 2016-04-04 — End: 2016-04-14

## 2016-04-04 MED ORDER — PREDNISONE 10 MG PO TABS: ORAL_TABLET | ORAL | 0 refills | Status: DC

## 2016-04-04 MED ORDER — KETOROLAC TROMETHAMINE 60 MG/2ML IM SOLN
60.0000 mg | Freq: Once | INTRAMUSCULAR | Status: AC
  Administered 2016-04-04: 60 mg via INTRAMUSCULAR

## 2016-04-04 MED FILL — levoFLOXacin 500 MG TABS: 500 | 10 days supply | Qty: 10 | Fill #0

## 2016-04-04 MED FILL — predniSONE 10 MG TABS: 10 | 9 days supply | Qty: 18 | Fill #0

## 2016-04-04 NOTE — Telephone Encounter (Signed)
Ok for corinne to call pt ; I was thinking she would be able to explain to pt the need for the CT before [facility]

## 2016-04-04 NOTE — Telephone Encounter (Signed)
Pt stated she did not want the CT scan done. Referral cancelled

## 2016-04-04 NOTE — Telephone Encounter (Signed)
Called patient to advise her that CT can be done this afternoon, patient does not want CT done at this time and will call back if she changes her mind

## 2016-04-04 NOTE — Patient Instructions (Addendum)
You had the steroid and pain shot today  Please take all new medication as prescribed - the antibiotic, and prednisone  Please continue all other medications as before, and refills have been done if requested.  Please have the pharmacy call with any other refills you may need.  Please keep your appointments with your specialists as you may have planned  Please go to the LAB in the Basement (turn left off the elevator) for the tests to be done today  You will be contacted by phone if any changes need to be made immediately.  Otherwise, you will receive a letter about your results with an explanation, but please check with MyChart first.  Please remember to sign up for MyChart if you have not done so, as this will be important to you in the future with finding out test results, communicating by private email, and scheduling acute appointments online when needed.

## 2016-04-04 NOTE — Assessment & Plan Note (Signed)
Etiology unclear, for LFT's today,  to f/u any worsening symptoms or concerns

## 2016-04-04 NOTE — Telephone Encounter (Signed)
Wbc quite elevated, and normal last visit  (also liver tests elevated mild but this is due to the mono)  Corinne to let pt know - since wbc high, we need to rule out any abscess of the throat  -   For stat CT neck with contrast

## 2016-04-04 NOTE — Telephone Encounter (Signed)
Lab called to inform that pts WBCs were at a critical level at 18.3.

## 2016-04-04 NOTE — Progress Notes (Signed)
Subjective:    Patient ID: Jennifer Parks, female    DOB: 09-May-1991, 25 y.o.   MRN: [id number]  HPI  Here after recent dx acute mono syndrome but "things are getting worse and worse" with ST, feeling of a lump, hard to swallow, HA and neck stiffness, diffuse back pain, sweating, and heat intolerant.   Also itches everywhere, no juandice, no rash.  Drinks quite a bit of water but urine still dark.  Denies urinary symptoms such as dysuria, frequency, urgency, flank pain, hematuria or n/v, fever, chills. Cough and sneeze is nonproductive and Pt denies chest pain, increased sob or doe, wheezing, orthopnea, PND, increased LE swelling, palpitations, dizziness or syncope. Has persistent pain and fatigue.  No high fever, chills  Pt denies new neurological symptoms such as new headache, or facial or extremity weakness or numbness   Pt denies polydipsia, polyuria   Recent monospot + and strep neg Was on azith/cephalexin previuosly.  No hx of STD or recent exposure, no sexual activity for several wks  Not pregnant, LMP last wk  Denies worsening depressive symptoms, suicidal ideation, or panic; has ongoing anxiety Lab Results  Component Value Date   WBC 5.3 03/28/2016   HGB 13.5 03/28/2016   HCT 38.8 03/28/2016   PLT 245.0 03/28/2016   GLUCOSE 78 03/08/2014   CHOL 167 03/08/2014   TRIG 96.0 03/08/2014   HDL 54.30 03/08/2014   LDLCALC 94 03/08/2014   ALT 13 03/08/2014   AST 22 03/08/2014   NA 137 03/08/2014   K 4.0 03/08/2014   CL 103 03/08/2014   CREATININE 0.8 03/08/2014   BUN 11 03/08/2014   CO2 31 03/08/2014   TSH 0.95 03/08/2014   Past Medical History:  Diagnosis Date  . Acne   . Anxiety   . Depression   . Headache    takes ibuprofen   Past Surgical History:  Procedure Laterality Date  . BREAST REDUCTION SURGERY Bilateral 12/20/2015   Procedure: BILATERAL BREAST REDUCTION AND LIPOSUCTION;  Surgeon: Peggye Formlaire S Dillingham, DO;  Location: [facility];  Service: Plastics;   Laterality: Bilateral;  . CYSTOSCOPY N/A 11/11/2013   Procedure: CYSTOSCOPY;  Surgeon: Zelphia CairoGretchen Adkins, MD;  Location: [facility];  Service: Gynecology;  Laterality: N/A;  . LAPAROSCOPY N/A 11/09/2013   Procedure: LAPAROSCOPY OPERATIVE WITH FULGERATION OF ENDOMETRIOSIS;  Surgeon: Zelphia CairoGretchen Adkins, MD;  Location: [facility];  Service: Gynecology;  Laterality: N/A;  . LAPAROTOMY  11/11/2013   Procedure: MINI LAPAROTOMY;  Surgeon: Zelphia CairoGretchen Adkins, MD;  Location: [facility];  Service: Gynecology;;  . WISDOM TOOTH EXTRACTION      reports that she has never smoked. She has never used smokeless tobacco. She reports that she drinks alcohol. She reports that she does not use drugs. family history includes COPD in her maternal grandfather and paternal grandmother; Heart disease in her paternal grandfather; Hyperlipidemia in her father and paternal grandfather; Lung cancer in her maternal grandfather; Mental illness in her mother and paternal grandmother. Allergies  Allergen Reactions  . Amoxicillin Anaphylaxis and Rash  . Penicillins Anaphylaxis    Pt states allergy is to all penicillins    Current Outpatient Prescriptions on File Prior to Visit  Medication Sig Dispense Refill  . Ascorbic Acid (VITAMIN C) 100 MG tablet Take 100 mg by mouth daily.    . Cyanocobalamin (VITAMIN B 12 PO) Take by mouth.    . cyclobenzaprine (FLEXERIL) 5 MG tablet Take 1 tablet (5 mg total) by mouth at bedtime as needed  for muscle spasms. 14 tablet 1  . FLUoxetine (PROZAC) 20 MG capsule Take 40 mg by mouth.    Marland Kitchen. FLUoxetine (PROZAC) 40 MG capsule Take 1 capsule (40 mg total) by mouth daily. 90 capsule 1  . HYDROcodone-acetaminophen (NORCO/VICODIN) 5-325 MG tablet Take by mouth.    . Multiple Vitamin (MULTIVITAMIN WITH MINERALS) TABS tablet Take 1 tablet by mouth daily. Reported on 10/13/2015    . norethindrone-ethinyl estradiol (JUNEL FE,GILDESS FE,LOESTRIN FE) 1-20 MG-MCG tablet Take 1 tablet by mouth daily.     Current  Facility-Administered Medications on File Prior to Visit  Medication Dose Route Frequency Provider Last Rate Last Dose  . tetanus & diphtheria toxoids (adult) (TENIVAC) injection 0.5 mL  0.5 mL Intramuscular Once Newt LukesValerie A Leschber, MD       Review of Systems  Constitutional: Negative for unusual diaphoresis or night sweats HENT: Negative for ear swelling or discharge Eyes: Negative for worsening visual haziness  Respiratory: Negative for choking and stridor.   Gastrointestinal: Negative for distension or worsening eructation Genitourinary: Negative for retention or change in urine volume.  Musculoskeletal: Negative for other MSK pain or swelling Skin: Negative for color change and worsening wound Neurological: Negative for tremors and numbness other than noted  Psychiatric/Behavioral: Negative for decreased concentration or agitation other than above       Objective:   Physical Exam BP 122/70   Pulse 86   Temp 98.3 F (36.8 C) (Oral)   Resp 20   Wt 166 lb (75.3 kg)   SpO2 97%   BMI 28.49 kg/m  VS noted, mild ill  Constitutional: Pt appears in no apparent distress HENT: Head: NCAT.  Right Ear: External ear normal.  Left Ear: External ear normal.  Eyes: . Pupils are equal, round, and reactive to light. Conjunctivae and EOM are normal Bilat tm's with mild erythema.  Max sinus areas non tender.  Pharynx with severe erythema, no exudate and no able to discern an abscess but has 2-3+ tonsillar hypertrophy without crypts or drainage/pus Neck: Normal range of motion. Neck supple. with large angle of jaw and submandibular tender LA bilat Cardiovascular: Normal rate and regular rhythm.   Pulmonary/Chest: Effort normal and breath sounds without rales or wheezing.  Abd:  Soft, NT, ND, + BS Neurological: Pt is alert. Not confused , motor grossly intact Skin: Skin is warm. No rash, no LE edema Psychiatric: Pt behavior is normal. No agitation.      Assessment & Plan:

## 2016-04-04 NOTE — Assessment & Plan Note (Addendum)
Mild situatainal worse,  Reassured, to f/u any worsening symptoms or concerns

## 2016-04-04 NOTE — Assessment & Plan Note (Addendum)
Persistent rather mod to severe exam c/w recent mono syndrome, no high fever/chills but still cant r/o superinfection, no abscess at this time, for levaquin asd, depomedrol IM, toradol IM, and possibly most important is predpac asd,  to f/u any worsening symptoms or concerns, to also check repeat Cbc and LFTs given itching without rash or juandice

## 2016-04-04 NOTE — Progress Notes (Signed)
Pre visit review using our clinic review tool, if applicable. No additional management support is needed unless otherwise documented below in the visit note. 

## 2016-04-05 ENCOUNTER — Ambulatory Visit (INDEPENDENT_AMBULATORY_CARE_PROVIDER_SITE_OTHER)
Admission: RE | Admit: 2016-04-05 | Discharge: 2016-04-05 | Disposition: A | Discharge status: Home / Self Care | Payer: 59 | Source: Ambulatory Visit | Attending: Internal Medicine | Admitting: Internal Medicine

## 2016-04-05 ENCOUNTER — Encounter: Payer: Self-pay | Admitting: Internal Medicine

## 2016-04-05 DIAGNOSIS — D72829 Elevated white blood cell count, unspecified: Secondary | ICD-10-CM | POA: Diagnosis not present

## 2016-04-05 DIAGNOSIS — J029 Acute pharyngitis, unspecified: Secondary | ICD-10-CM | POA: Diagnosis not present

## 2016-04-05 DIAGNOSIS — R070 Pain in throat: Secondary | ICD-10-CM | POA: Diagnosis not present

## 2016-04-05 MED ORDER — IOPAMIDOL (ISOVUE-300) INJECTION 61%
75.0000 mL | Freq: Once | INTRAVENOUS | Status: AC | PRN
  Administered 2016-04-05: 75 mL via INTRAVENOUS

## 2016-04-05 NOTE — Telephone Encounter (Signed)
Pt's mother called stating when I called yesterday I woke her up and she didn't realize what she said. I have her scheduled today for her CT at 11:00. She also wanted me to tell you pt has stopped taking ibuprofen.

## 2016-04-17 ENCOUNTER — Telehealth: Payer: Self-pay | Admitting: Emergency Medicine

## 2016-04-17 NOTE — Telephone Encounter (Signed)
Patient called back in.  Gave her response.  Patient did not want to schedule TB test at this time.

## 2016-04-17 NOTE — Telephone Encounter (Signed)
Received paperwork from patients employer?? LVM with pt, she will need to come in for TB test.

## 2016-04-17 NOTE — Telephone Encounter (Signed)
Noted, will keep pts papework until needed.

## 2016-04-22 MED FILL — FLUoxetine HCL 40 MG CAPS: 40 | 90 days supply | Qty: 90 | Fill #1

## 2016-04-22 MED FILL — CEPHALEXIN 500 MG CAPSULE: 500 | 30 days supply | Qty: 60 | Fill #3

## 2016-04-23 MED FILL — CLINDAMYCIN PH 1% GEL: 1 | 30 days supply | Qty: 60 | Fill #0

## 2016-05-31 ENCOUNTER — Encounter: Payer: Self-pay | Admitting: Internal Medicine

## 2016-06-03 MED ORDER — FLUOXETINE HCL 60 MG PO TABS: 60.0000 mg | ORAL_TABLET | Freq: Every day | ORAL | 3 refills | Status: DC

## 2016-06-03 MED FILL — FLUoxetine HCL 20 MG CAPS: 20 | 30 days supply | Qty: 90 | Fill #0

## 2016-06-04 DIAGNOSIS — Z23 Encounter for immunization: Secondary | ICD-10-CM | POA: Diagnosis not present

## 2016-06-04 DIAGNOSIS — Z111 Encounter for screening for respiratory tuberculosis: Secondary | ICD-10-CM | POA: Diagnosis not present

## 2016-06-21 MED FILL — NUVARING VAGINAL RING: 0.12-0.015 | 84 days supply | Qty: 3 | Fill #1

## 2016-07-09 ENCOUNTER — Telehealth: Payer: Self-pay | Admitting: Internal Medicine

## 2016-07-09 DIAGNOSIS — G43809 Other migraine, not intractable, without status migrainosus: Secondary | ICD-10-CM

## 2016-07-09 NOTE — Telephone Encounter (Signed)
Referral ordred 

## 2016-07-09 NOTE — Telephone Encounter (Signed)
Patient is [facility] to be seen by Dr. Adair PatterPenumolli.  Patient has seen this provider in the past for migraines but has not been to office in over 3 years.  Office is requesting referral for PCP.

## 2016-07-09 NOTE — Telephone Encounter (Signed)
Please advise 

## 2016-07-16 MED FILL — CLINDAMYCIN PH 1% GEL: 1 | 30 days supply | Qty: 60 | Fill #1

## 2016-07-16 MED FILL — FLUoxetine HCL 20 MG CAPS: 20 | 30 days supply | Qty: 90 | Fill #1

## 2016-07-17 MED FILL — CEPHALEXIN 500 MG CAPSULE: 500 | 30 days supply | Qty: 60 | Fill #0

## 2016-08-02 ENCOUNTER — Encounter: Payer: Self-pay | Admitting: Diagnostic Neuroimaging

## 2016-08-02 ENCOUNTER — Ambulatory Visit (INDEPENDENT_AMBULATORY_CARE_PROVIDER_SITE_OTHER): Payer: 59 | Admitting: Diagnostic Neuroimaging

## 2016-08-02 VITALS — BP 111/72 | HR 77 | Ht 64.0 in | Wt 173.8 lb

## 2016-08-02 DIAGNOSIS — R945 Abnormal results of liver function studies: Secondary | ICD-10-CM

## 2016-08-02 DIAGNOSIS — G43009 Migraine without aura, not intractable, without status migrainosus: Secondary | ICD-10-CM

## 2016-08-02 DIAGNOSIS — R7989 Other specified abnormal findings of blood chemistry: Secondary | ICD-10-CM

## 2016-08-02 MED ORDER — TOPIRAMATE 50 MG PO TABS: 50.0000 mg | ORAL_TABLET | Freq: Two times a day (BID) | ORAL | 12 refills | Status: DC

## 2016-08-02 MED ORDER — ZOLMITRIPTAN 5 MG NA SOLN
1.0000 | NASAL | 6 refills | Status: DC | PRN
Start: 2016-08-02 — End: 2017-01-06

## 2016-08-02 MED FILL — TOPIRAMATE 50 MG TABLET: 50 | 30 days supply | Qty: 60 | Fill #0

## 2016-08-02 MED FILL — ZOMIG 5 MG NASAL SPRAY: 5 | 30 days supply | Qty: 6 | Fill #0

## 2016-08-02 NOTE — Progress Notes (Addendum)
[facility]  PATIENT: Jennifer Parks DOB: 1991/02/19  REFERRING CLINICIAN: S Burns HISTORY FROM: patient  REASON FOR VISIT: new consult / existing patient    HISTORICAL  CHIEF COMPLAINT:  Chief Complaint  Patient presents with  . New Patient (Initial Visit)    PT saw Dr. Marjory LiesPenumalli in 2013 for Headaches, no vision issues with the headaches    HISTORY OF PRESENT ILLNESS:   25 year old female here for evaluation of migraine headaches. Patient last seen March 2014. In summary patient had a snowboarding accident February 2012 resulting in concussion and 30 minutes of confusion. CT scan was unremarkable at that time. In August 2012 she began to develop migraine headaches with frontal squeezing painful headaches with nausea, photophobia and phonophobia. Patient was treated with topiramate and Zomig with good results. In 2015 patient had surgery for endometriosis as well as bladder repair surgery following this. During this time she transitioned off of antimigraine medications and headaches continued to stay under control. However in the past year migraines have returned. Headaches worsened over time almost every other day. In the last couple weeks headaches have been slightly better. Patient is having significant difficulty and sometimes having to leave work early. She would like to get back onto migraine prevention of migraine rescue medication. Otherwise her headaches are similar to prior headaches.    REVIEW OF SYSTEMS: Full 14 system review of systems performed and negative with exception of: Light sensitivity dizziness headache.   ALLERGIES: Allergies  Allergen Reactions  . Amoxicillin Anaphylaxis and Rash  . Penicillins Anaphylaxis    Pt states allergy is to all penicillins     HOME MEDICATIONS: Outpatient Medications Prior to Visit  Medication Sig Dispense Refill  . FLUoxetine HCl 60 MG TABS Take 60 mg by mouth daily. 30 tablet 3  . Ascorbic Acid  (VITAMIN C) 100 MG tablet Take 100 mg by mouth daily.    . Cyanocobalamin (VITAMIN B 12 PO) Take by mouth.    . cyclobenzaprine (FLEXERIL) 5 MG tablet Take 1 tablet (5 mg total) by mouth at bedtime as needed for muscle spasms. (Patient not taking: Reported on 08/02/2016) 14 tablet 1  . HYDROcodone-acetaminophen (NORCO/VICODIN) 5-325 MG tablet Take by mouth.    . Multiple Vitamin (MULTIVITAMIN WITH MINERALS) TABS tablet Take 1 tablet by mouth daily. Reported on 10/13/2015    . norethindrone-ethinyl estradiol (JUNEL FE,GILDESS FE,LOESTRIN FE) 1-20 MG-MCG tablet Take 1 tablet by mouth daily.    . predniSONE (DELTASONE) 10 MG tablet 3 tabs by mouth per day for 3 days,2tabs per day for 3 days,1tab per day for 3 days (Patient not taking: Reported on 08/02/2016) 18 tablet 0   Facility-Administered Medications Prior to Visit  Medication Dose Route Frequency Provider Last Rate Last Dose  . tetanus & diphtheria toxoids (adult) (TENIVAC) injection 0.5 mL  0.5 mL Intramuscular Once Newt LukesValerie A Leschber, MD        PAST MEDICAL HISTORY: Past Medical History:  Diagnosis Date  . Acne   . Anxiety   . Depression   . Headache    takes ibuprofen    PAST SURGICAL HISTORY: Past Surgical History:  Procedure Laterality Date  . BREAST REDUCTION SURGERY Bilateral 12/20/2015   Procedure: BILATERAL BREAST REDUCTION AND LIPOSUCTION;  Surgeon: Peggye Formlaire S Dillingham, DO;  Location: [facility];  Service: Plastics;  Laterality: Bilateral;  . CYSTOSCOPY N/A 11/11/2013   Procedure: CYSTOSCOPY;  Surgeon: Zelphia CairoGretchen Adkins, MD;  Location: [facility];  Service: Gynecology;  Laterality:  N/A;  . LAPAROSCOPY N/A 11/09/2013   Procedure: LAPAROSCOPY OPERATIVE WITH FULGERATION OF ENDOMETRIOSIS;  Surgeon: Zelphia CairoGretchen Adkins, MD;  Location: [facility];  Service: Gynecology;  Laterality: N/A;  . LAPAROTOMY  11/11/2013   Procedure: MINI LAPAROTOMY;  Surgeon: Zelphia CairoGretchen Adkins, MD;  Location: [facility];  Service: Gynecology;;  . Leone HavenWISDOM TOOTH  EXTRACTION      FAMILY HISTORY: Family History  Problem Relation Age of Onset  . Mental illness Mother   . Hyperlipidemia Father   . Lung cancer Maternal Grandfather   . COPD Maternal Grandfather   . Mental illness Paternal Grandmother   . COPD Paternal Grandmother   . Hyperlipidemia Paternal Grandfather   . Heart disease Paternal Grandfather     SOCIAL HISTORY:  Social History   Social History  . Marital status: Single    Spouse name: N/A  . Number of children: N/A  . Years of education: N/A   Occupational History  . Not on file.   Social History Main Topics  . Smoking status: Never Smoker  . Smokeless tobacco: Never Used     Comment: single - student at [employer]  . Alcohol use Yes     Comment: a few glasses of wine per week  . Drug use: No  . Sexual activity: Yes    Birth control/ protection: Pill   Other Topics Concern  . Not on file   Social History Narrative  . No narrative on file     PHYSICAL EXAM  GENERAL EXAM/CONSTITUTIONAL: Vitals:  Vitals:   08/02/16 1125  BP: 111/72  Pulse: 77  Weight: 173 lb 12.8 oz (78.8 kg)  Height: 5\' 4"  (1.626 m)     Body mass index is 29.83 kg/m.  No exam data present  Patient is in no distress; well developed, nourished and groomed; neck is supple  CARDIOVASCULAR:  Examination of carotid arteries is normal; no carotid bruits  Regular rate and rhythm, no murmurs  Examination of peripheral vascular system by observation and palpation is normal  EYES:  Ophthalmoscopic exam of optic discs and posterior segments is normal; no papilledema or hemorrhages  MUSCULOSKELETAL:  Gait, strength, tone, movements noted in Neurologic exam below  NEUROLOGIC: MENTAL STATUS:  No flowsheet data found.  awake, alert, oriented to person, place and time  recent and remote memory intact  normal attention and concentration  language fluent, comprehension intact, naming intact,   fund of knowledge  appropriate  CRANIAL NERVE:   2nd - no papilledema on fundoscopic exam  2nd, 3rd, 4th, 6th - pupils equal and reactive to light, visual fields full to confrontation, extraocular muscles intact, no nystagmus  5th - facial sensation symmetric  7th - facial strength symmetric  8th - hearing intact  9th - palate elevates symmetrically, uvula midline  11th - shoulder shrug symmetric  12th - tongue protrusion midline  MOTOR:   normal bulk and tone, full strength in the BUE, BLE  SENSORY:   normal and symmetric to light touch, temperature, vibration  COORDINATION:   finger-nose-finger, fine finger movements normal  REFLEXES:   deep tendon reflexes present and symmetric  GAIT/STATION:   narrow based gait; able to walk tandem; romberg is negative    DIAGNOSTIC DATA (LABS, IMAGING, TESTING) - I reviewed patient records, labs, notes, testing and imaging myself where available.  Lab Results  Component Value Date   WBC 18.3 cH (HH) 04/04/2016   HGB 14.1 04/04/2016   HCT 41.0 04/04/2016   MCV 86.5 04/04/2016  PLT 231.0 04/04/2016      Component Value Date/Time   NA 136 04/04/2016 1141   K 3.8 04/04/2016 1141   CL 103 04/04/2016 1141   CO2 28 04/04/2016 1141   GLUCOSE 84 04/04/2016 1141   BUN 5 (L) 04/04/2016 1141   CREATININE 0.59 04/04/2016 1141   CALCIUM 8.6 04/04/2016 1141   PROT 6.9 04/04/2016 1141   ALBUMIN 3.7 04/04/2016 1141   AST 185 (H) 04/04/2016 1141   ALT 263 (H) 04/04/2016 1141   ALKPHOS 458 (H) 04/04/2016 1141   BILITOT 1.4 (H) 04/04/2016 1141   GFRNONAA >90 11/13/2013 0830   GFRAA >90 11/13/2013 0830   Lab Results  Component Value Date   CHOL 167 03/08/2014   HDL 54.30 03/08/2014   LDLCALC 94 03/08/2014   TRIG 96.0 03/08/2014   CHOLHDL 3 03/08/2014   No results found for: HGBA1C No results found for: VITAMINB12 Lab Results  Component Value Date   TSH 0.95 03/08/2014    02/20/12 MRI brain  - Equivocal MRI demonstrating few  nonspecific foci of gliosis.    ASSESSMENT AND PLAN  25 y.o. year old female here with migraine headaches following concussion in February 2012 snowboarding accident, with recurrent headaches recently.   Dx:  1. Migraine without aura and without status migrainosus, not intractable   2. Abnormal LFTs      PLAN: - repeat labs (follow up leukocytosis and LFT abnl) - restart topiramate + zolmitriptan - medications cautions reviewed  Orders Placed This Encounter  Procedures  . CBC with Differential/Platelet  . Comprehensive metabolic panel   Meds ordered this encounter  Medications  . topiramate (TOPAMAX) 50 MG tablet    Sig: Take 1 tablet (50 mg total) by mouth 2 (two) times daily.    Dispense:  60 tablet    Refill:  12  . zolmitriptan (ZOMIG) 5 MG nasal solution    Sig: Place 1 spray into the nose as needed for migraine.    Dispense:  6 Units    Refill:  6   Return in about 3 months (around 10/31/2016).    Suanne Marker, MD 08/02/2016, 11:48 AM Certified in Neurology, Neurophysiology and Neuroimaging  [facility] [address] [place], [place] [address] [phone]

## 2016-08-02 NOTE — Patient Instructions (Addendum)
-   start topiramate 50mg  daily; drink plenty of water  - zomig nasal spray as needed for breakthrough headache

## 2016-08-03 LAB — CBC WITH DIFFERENTIAL/PLATELET
BASOS ABS: 0 10*3/uL (ref 0.0–0.2)
BASOS: 0 %
EOS (ABSOLUTE): 0.1 10*3/uL (ref 0.0–0.4)
Eos: 1 %
HEMOGLOBIN: 14.2 g/dL (ref 11.1–15.9)
Hematocrit: 41.7 % (ref 34.0–46.6)
IMMATURE GRANS (ABS): 0 10*3/uL (ref 0.0–0.1)
IMMATURE GRANULOCYTES: 0 %
LYMPHS: 32 %
Lymphocytes Absolute: 3.2 10*3/uL — ABNORMAL HIGH (ref 0.7–3.1)
MCH: 28.6 pg (ref 26.6–33.0)
MCHC: 34.1 g/dL (ref 31.5–35.7)
MCV: 84 fL (ref 79–97)
MONOCYTES: 11 %
Monocytes Absolute: 1.1 10*3/uL — ABNORMAL HIGH (ref 0.1–0.9)
NEUTROS ABS: 5.3 10*3/uL (ref 1.4–7.0)
NEUTROS PCT: 56 %
PLATELETS: 351 10*3/uL (ref 150–379)
RBC: 4.97 x10E6/uL (ref 3.77–5.28)
RDW: 13.2 % (ref 12.3–15.4)
WBC: 9.8 10*3/uL (ref 3.4–10.8)

## 2016-08-03 LAB — COMPREHENSIVE METABOLIC PANEL
ALBUMIN: 4.4 g/dL (ref 3.5–5.5)
ALT: 20 IU/L (ref 0–32)
AST: 24 IU/L (ref 0–40)
Albumin/Globulin Ratio: 1.8 (ref 1.2–2.2)
Alkaline Phosphatase: 116 IU/L (ref 39–117)
BILIRUBIN TOTAL: 0.2 mg/dL (ref 0.0–1.2)
BUN / CREAT RATIO: 15 (ref 9–23)
BUN: 10 mg/dL (ref 6–20)
CALCIUM: 9.6 mg/dL (ref 8.7–10.2)
CHLORIDE: 101 mmol/L (ref 96–106)
CO2: 27 mmol/L (ref 18–29)
Creatinine, Ser: 0.68 mg/dL (ref 0.57–1.00)
GFR, EST AFRICAN AMERICAN: 142 mL/min/{1.73_m2} (ref >59)
GFR, EST NON AFRICAN AMERICAN: 123 mL/min/{1.73_m2} (ref >59)
GLUCOSE: 81 mg/dL (ref 65–99)
Globulin, Total: 2.5 g/dL (ref 1.5–4.5)
Potassium: 4.7 mmol/L (ref 3.5–5.2)
Sodium: 141 mmol/L (ref 134–144)
TOTAL PROTEIN: 6.9 g/dL (ref 6.0–8.5)

## 2016-08-09 ENCOUNTER — Telehealth: Payer: Self-pay | Admitting: *Deleted

## 2016-08-09 NOTE — Telephone Encounter (Signed)
Per Dr Marjory LiesPenumalli, spoke with patient and informed her that her lab results are unremarkable, continue with current treatment plan. She verbalized understanding, appreciation.

## 2016-10-11 MED FILL — TOPIRAMATE 50 MG TABLET: 50 | 30 days supply | Qty: 60 | Fill #1

## 2016-10-11 MED FILL — CLINDAMYCIN PH 1% GEL: 1 | 30 days supply | Qty: 60 | Fill #2

## 2016-10-11 MED FILL — FLUoxetine HCL 20 MG CAPS: 20 | 30 days supply | Qty: 90 | Fill #2

## 2016-10-11 MED FILL — CEPHALEXIN 500 MG CAPSULE: 500 | 30 days supply | Qty: 60 | Fill #1

## 2016-10-17 ENCOUNTER — Telehealth: Payer: 59 | Admitting: Physician Assistant

## 2016-10-17 DIAGNOSIS — H5710 Ocular pain, unspecified eye: Secondary | ICD-10-CM

## 2016-10-17 DIAGNOSIS — H1089 Other conjunctivitis: Secondary | ICD-10-CM | POA: Diagnosis not present

## 2016-10-17 NOTE — Progress Notes (Signed)
Based on what you shared with me it looks like you have a serious condition that should be evaluated in a face to face office visit. Due to severity of eye pain and vision changes you need a good examination of the eye so the correct treatment an be given.   NOTE: Even if you have entered your credit card information for this eVisit, you will not be charged.   If you are having a true medical emergency please call 911.  If you need an urgent face to face visit, [place] has four urgent care centers for your convenience.  If you need care fast and have a high deductible or no insurance consider:   [email or web address]  [phone]  [address], [address] [place], [place] [address] 8 am to 8 pm Monday-Friday 10 am to 4 pm Saturday-Sunday   The following sites will take your  insurance:    . [facility]  [phone] Get Driving Directions Find a Provider at this Location  [address] [place], [place] [address] . 10 am to 8 pm Monday-Friday . 12 pm to 8 pm Saturday-Sunday   . [facility]  [phone] Get Driving Directions Find a Provider at this Location  [address], [address] [place], [place] [address] . 8 am to 8 pm Monday-Friday . 9 am to 6 pm Saturday . 11 am to 6 pm Sunday   . [facility] [place]  [phone] Get Driving Directions  [address] [place], [place] [address] . 8 am to 8 pm Monday-Friday . 8 am to 4 pm Saturday-Sunday   Your e-visit answers were reviewed by a board certified advanced clinical practitioner to complete your personal care plan.  Thank you for using e-Visits.

## 2016-10-28 ENCOUNTER — Ambulatory Visit: Payer: 59 | Admitting: Diagnostic Neuroimaging

## 2016-10-29 ENCOUNTER — Encounter: Payer: Self-pay | Admitting: Diagnostic Neuroimaging

## 2016-11-22 MED FILL — FLUoxetine HCL 20 MG CAPS: 20 | 30 days supply | Qty: 90 | Fill #3

## 2016-12-26 ENCOUNTER — Other Ambulatory Visit: Payer: Self-pay | Admitting: Internal Medicine

## 2016-12-26 MED FILL — CLINDAMYCIN PH 1% GEL: 1 | 30 days supply | Qty: 60 | Fill #3

## 2016-12-26 NOTE — Telephone Encounter (Signed)
Last I knew she was on 40 mg.  Call her and ask how much she is on.  Ok to give one month supply, but needs to f/u with me

## 2016-12-26 NOTE — Telephone Encounter (Signed)
Please advise, 60 mg is on med list. Pt has not been seen in over a year by you

## 2016-12-30 NOTE — Telephone Encounter (Signed)
Spoke with pt. She has an appt with us to discuss medication change on 5/25. She states she is currently taking 60 mg daily. She has enough to make it until her appt on Friday.

## 2017-01-02 NOTE — Progress Notes (Signed)
Subjective:    Patient ID: Jennifer Parks, female    DOB: [birth year], 26 y.o.   MRN: [id number]  HPI She is here for follow up.  Depression, anxiety:  She is taking her medication as prescribed.  For the past month or so her anxiety and depression have not been controlled. She has been on the Prozac for years, but it does not feel like it is working. She is unsure if it needs to be increased or if she needs medication. She has never been on any other medication. Both her anxiety and depression Controlled. She denies any specific reason for changes-no additional stress in her life and no major life changes.  She has noted decreased appetite and difficulty sleeping. She has had 2 panic attacks in the past month associated with shortness of breath, chest discomfort and palpitations.  Medications and allergies reviewed with patient and updated if appropriate.  Patient Active Problem List   Diagnosis Date Noted  . Itching 04/04/2016  . Anxiety 11/21/2015  . Eating disorder 11/21/2015  . Attention deficit hyperactivity disorder (ADHD) 11/21/2015  . Endometriosis 11/12/2013  . Depression   . Acne     Current Outpatient Prescriptions on File Prior to Visit  Medication Sig Dispense Refill  . FLUoxetine HCl 60 MG TABS Take 60 mg by mouth daily. 30 tablet 3  . ibuprofen (ADVIL,MOTRIN) 200 MG tablet Take by mouth.    . topiramate (TOPAMAX) 50 MG tablet Take 1 tablet (50 mg total) by mouth 2 (two) times daily. 60 tablet 12  . zolmitriptan (ZOMIG) 5 MG nasal solution Place 1 spray into the nose as needed for migraine. 6 Units 6   No current facility-administered medications on file prior to visit.     Past Medical History:  Diagnosis Date  . Acne   . Anxiety   . Depression   . Headache    takes ibuprofen    Past Surgical History:  Procedure Laterality Date  . BREAST REDUCTION SURGERY Bilateral 12/20/2015   Procedure: BILATERAL BREAST REDUCTION AND LIPOSUCTION;  Surgeon: Peggye Formlaire S  Dillingham, DO;  Location: [facility];  Service: Plastics;  Laterality: Bilateral;  . CYSTOSCOPY N/A 11/11/2013   Procedure: CYSTOSCOPY;  Surgeon: Zelphia CairoGretchen Adkins, MD;  Location: [facility];  Service: Gynecology;  Laterality: N/A;  . LAPAROSCOPY N/A 11/09/2013   Procedure: LAPAROSCOPY OPERATIVE WITH FULGERATION OF ENDOMETRIOSIS;  Surgeon: Zelphia CairoGretchen Adkins, MD;  Location: [facility];  Service: Gynecology;  Laterality: N/A;  . LAPAROTOMY  11/11/2013   Procedure: MINI LAPAROTOMY;  Surgeon: Zelphia CairoGretchen Adkins, MD;  Location: [facility];  Service: Gynecology;;  . Leone HavenWISDOM TOOTH EXTRACTION      Social History   Social History  . Marital status: Single    Spouse name: N/A  . Number of children: N/A  . Years of education: N/A   Social History Main Topics  . Smoking status: Never Smoker  . Smokeless tobacco: Never Used     Comment: single - student at [employer]  . Alcohol use Yes     Comment: a few glasses of wine per week  . Drug use: No  . Sexual activity: Yes    Birth control/ protection: Pill   Other Topics Concern  . Not on file   Social History Narrative  . No narrative on file    Family History  Problem Relation Age of Onset  . Mental illness Mother   . Hyperlipidemia Father   . Lung cancer Maternal Grandfather   .  COPD Maternal Grandfather   . Mental illness Paternal Grandmother   . COPD Paternal Grandmother   . Hyperlipidemia Paternal Grandfather   . Heart disease Paternal Grandfather     Review of Systems  Constitutional: Positive for appetite change (decreased) and fatigue.  Respiratory: Positive for shortness of breath (with panic attacks).   Cardiovascular: Positive for chest pain (with panic attacks) and palpitations (with panic attacks).  Gastrointestinal: Negative for constipation and diarrhea.  Neurological: Negative for headaches.  Psychiatric/Behavioral: Positive for decreased concentration, dysphoric mood and sleep disturbance. Negative for self-injury and suicidal  ideas. The patient is nervous/anxious (occ panic attacks - 2/last month).        Objective:   Vitals:   01/03/17 1515  BP: 108/76  Pulse: 83  Resp: 16  Temp: 98.2 F (36.8 C)   Filed Weights   01/03/17 1515  Weight: 175 lb (79.4 kg)   Body mass index is 30.04 kg/m.  Wt Readings from Last 3 Encounters:  01/03/17 175 lb (79.4 kg)  08/02/16 173 lb 12.8 oz (78.8 kg)  04/04/16 166 lb (75.3 kg)     Physical Exam Constitutional: Appears well-developed and well-nourished. No distress.  HENT:  Head: Normocephalic and atraumatic.  Neck: Neck supple. No tracheal deviation present. No thyromegaly present.  No cervical lymphadenopathy Cardiovascular: Normal rate, regular rhythm and normal heart sounds.   No murmur heard. No carotid bruit .  No edema Pulmonary/Chest: Effort normal and breath sounds normal. No respiratory distress. No has no wheezes. No rales.  Skin: Skin is warm and dry. Not diaphoretic.  Psychiatric: depressed mood and affect. Behavior is normal.          Assessment & Plan:   See Problem List for Assessment and Plan of chronic medical problems.

## 2017-01-03 ENCOUNTER — Encounter: Payer: Self-pay | Admitting: Internal Medicine

## 2017-01-03 ENCOUNTER — Ambulatory Visit (INDEPENDENT_AMBULATORY_CARE_PROVIDER_SITE_OTHER): Payer: 59 | Admitting: Internal Medicine

## 2017-01-03 DIAGNOSIS — F3289 Other specified depressive episodes: Secondary | ICD-10-CM

## 2017-01-03 DIAGNOSIS — F419 Anxiety disorder, unspecified: Secondary | ICD-10-CM

## 2017-01-03 MED ORDER — SERTRALINE HCL 100 MG PO TABS: ORAL_TABLET | ORAL | 5 refills | Status: DC

## 2017-01-03 MED ORDER — FLUOXETINE HCL 20 MG PO CAPS: ORAL_CAPSULE | ORAL | 0 refills | Status: DC

## 2017-01-03 MED FILL — SERTRALINE HCL 100 MG TAB: 100 | 30 days supply | Qty: 30 | Fill #0 | Status: TO

## 2017-01-03 MED FILL — FLUoxetine HCL 20 MG CAPS: 20 | 30 days supply | Qty: 30 | Fill #0

## 2017-01-03 NOTE — Assessment & Plan Note (Signed)
prozac not working and anxiety is poorly controlled Associated with panic attacks-she has had 2 in the past month associated with chest discomfort, palpitations and shortness of breath Depression also not controlled Taper off Prozac quickly and start sertraline Follow-up in 6 weeks, sooner if needed She will call if any side effects of tapering off the Prozac or starting the sertraline

## 2017-01-03 NOTE — Patient Instructions (Addendum)
prozac 40 mg x three days, if no side effects then decrease to 20 mg daily for three days, then stop if you have no side effects after those three days When you stop the prozac start sertraline 50 mg daily - take at night.  After one week increase to 100 mg nightly.   Follow up in 6 weeks, sooner if needed.

## 2017-01-06 ENCOUNTER — Ambulatory Visit (HOSPITAL_COMMUNITY)
Admission: EM | Admit: 2017-01-06 | Discharge: 2017-01-06 | Disposition: A | Discharge status: Home / Self Care | Payer: 59 | Attending: Family Medicine | Admitting: Family Medicine

## 2017-01-06 ENCOUNTER — Telehealth: Payer: 59 | Admitting: Family

## 2017-01-06 ENCOUNTER — Encounter (HOSPITAL_COMMUNITY): Payer: Self-pay | Admitting: Emergency Medicine

## 2017-01-06 DIAGNOSIS — N926 Irregular menstruation, unspecified: Secondary | ICD-10-CM | POA: Diagnosis not present

## 2017-01-06 DIAGNOSIS — Z3202 Encounter for pregnancy test, result negative: Secondary | ICD-10-CM | POA: Diagnosis not present

## 2017-01-06 DIAGNOSIS — B3731 Acute candidiasis of vulva and vagina: Secondary | ICD-10-CM

## 2017-01-06 DIAGNOSIS — B373 Candidiasis of vulva and vagina: Secondary | ICD-10-CM | POA: Diagnosis not present

## 2017-01-06 DIAGNOSIS — R300 Dysuria: Secondary | ICD-10-CM | POA: Diagnosis not present

## 2017-01-06 DIAGNOSIS — N809 Endometriosis, unspecified: Secondary | ICD-10-CM

## 2017-01-06 DIAGNOSIS — N898 Other specified noninflammatory disorders of vagina: Secondary | ICD-10-CM

## 2017-01-06 HISTORY — DX: Endometriosis, unspecified: N80.9

## 2017-01-06 LAB — POCT URINALYSIS DIP (DEVICE)
Bilirubin Urine: NEGATIVE
Glucose, UA: NEGATIVE mg/dL
HGB URINE DIPSTICK: NEGATIVE
Ketones, ur: NEGATIVE mg/dL
LEUKOCYTES UA: NEGATIVE
NITRITE: NEGATIVE
PH: 5.5 (ref 5.0–8.0)
Protein, ur: NEGATIVE mg/dL
UROBILINOGEN UA: 0.2 mg/dL (ref 0.0–1.0)

## 2017-01-06 LAB — POCT PREGNANCY, URINE: Preg Test, Ur: NEGATIVE

## 2017-01-06 MED ORDER — FLUCONAZOLE 150 MG PO TABS: 150.0000 mg | ORAL_TABLET | Freq: Once | ORAL | 0 refills | Status: AC

## 2017-01-06 NOTE — ED Triage Notes (Signed)
[facility] a [facility] discharge and dysuria x 6 days.

## 2017-01-06 NOTE — Progress Notes (Signed)
Thank you for the details you put in the comment boxes. Those details really help us take better care of you. Due to discharge from urethra and vaginal regions, this requires a thorough physical exam due to the possibility of missing something serious.   Based on what you shared with me it looks like you have a serious condition that should be evaluated in a face to face office visit.  NOTE: Even if you have entered your credit card information for this eVisit, you will not be charged.   If you are having a true medical emergency please call 911.  If you need an urgent face to face visit, [place] has four urgent care centers for your convenience.  If you need care fast and have a high deductible or no insurance consider:   [email or web address]  [phone]  [address], [address] [place], [place] [address] 8 am to 8 pm Monday-Friday 10 am to 4 pm Saturday-Sunday   The following sites will take your  insurance:    . [facility]  [phone] Get Driving Directions Find a Provider at this Location  [address] [place], [place] [address] . 10 am to 8 pm Monday-Friday . 12 pm to 8 pm Saturday-Sunday   . [facility]  [phone] Get Driving Directions Find a Provider at this Location  [address] [place], [place] [address] . 8 am to 8 pm Monday-Friday . 9 am to 6 pm Saturday . 11 am to 6 pm Sunday   . [facility] [place]  [phone] Get Driving Directions  [address] [place], [place] [address] . 8 am to 8 pm Monday-Friday . 8 am to 4 pm Saturday-Sunday   Your e-visit answers were reviewed by a board certified advanced clinical practitioner to complete your personal care plan.  Thank you for using e-Visits.

## 2017-01-06 NOTE — Discharge Instructions (Signed)
Often when people are on a new diet, particularly one that involves weight change, there is menstrual irregularity.  Your urine is normal. You're not pregnant. You do have evidence for yeast infection which is a non-sexually transmitted issue.

## 2017-01-06 NOTE — ED Provider Notes (Signed)
[facility]    CSN: [id number] Arrival date & time: 01/06/17  1018     History   Chief Complaint Chief Complaint  Patient presents with  . Vaginal Discharge    HPI Jennifer Parks is a 26 y.o. female.   The patient presented to the [facility] with a complaint of a vaginal discharge and dysuria x 6 days.  Patient has a history of endometriosis. Sure last period was March 25. Last Wednesday she began having spotting (5 days ago) he and then she noticed 2 different areas on her underwear which showed a black discharge. She's had no abdominal pain.  Patient uses condoms for contraception. Patient is a Human resources officer.  She's been on a cleansing diet for the last week or so.      Past Medical History:  Diagnosis Date  . Acne   . Anxiety   . Depression   . Endometriosis   . Headache    takes ibuprofen    Patient Active Problem List   Diagnosis Date Noted  . Itching 04/04/2016  . Anxiety 11/21/2015  . Eating disorder 11/21/2015  . Attention deficit hyperactivity disorder (ADHD) 11/21/2015  . Endometriosis 11/12/2013  . Depression   . Acne     Past Surgical History:  Procedure Laterality Date  . BREAST REDUCTION SURGERY Bilateral 12/20/2015   Procedure: BILATERAL BREAST REDUCTION AND LIPOSUCTION;  Surgeon: Peggye Form, DO;  Location: [facility];  Service: Plastics;  Laterality: Bilateral;  . CYSTOSCOPY N/A 11/11/2013   Procedure: CYSTOSCOPY;  Surgeon: Zelphia Cairo, MD;  Location: [facility];  Service: Gynecology;  Laterality: N/A;  . LAPAROSCOPY N/A 11/09/2013   Procedure: LAPAROSCOPY OPERATIVE WITH FULGERATION OF ENDOMETRIOSIS;  Surgeon: Zelphia Cairo, MD;  Location: [facility];  Service: Gynecology;  Laterality: N/A;  . LAPAROTOMY  11/11/2013   Procedure: MINI LAPAROTOMY;  Surgeon: Zelphia Cairo, MD;  Location: [facility];  Service: Gynecology;;  . [place] TOOTH EXTRACTION      OB History    No data available       Home Medications     Prior to Admission medications   Medication Sig Start Date End Date Taking? Authorizing Provider  FLUoxetine (PROZAC) 20 MG capsule Taper as prescribed 01/03/17  Yes Burns, Bobette Mo, MD  topiramate (TOPAMAX) 50 MG tablet Take 1 tablet (50 mg total) by mouth 2 (two) times daily. 08/02/16  Yes Penumalli, Glenford Bayley, MD  fluconazole (DIFLUCAN) 150 MG tablet Take 1 tablet (150 mg total) by mouth once. Repeat if needed 01/06/17 01/06/17  Elvina Sidle, MD    Family History Family History  Problem Relation Age of Onset  . Mental illness Mother   . Hyperlipidemia Father   . Lung cancer Maternal Grandfather   . COPD Maternal Grandfather   . Mental illness Paternal Grandmother   . COPD Paternal Grandmother   . Hyperlipidemia Paternal Grandfather   . Heart disease Paternal Grandfather     Social History Social History  Substance Use Topics  . Smoking status: Never Smoker  . Smokeless tobacco: Never Used     Comment: single - student at [employer]  . Alcohol use Yes     Comment: a few glasses of wine per week     Allergies   Amoxicillin and Penicillins   Review of Systems Review of Systems  Constitutional: Negative.   Genitourinary: Positive for vaginal discharge.  All other systems reviewed and are negative.    Physical Exam Triage Vital Signs ED Triage  Vitals  Enc Vitals Group     BP 01/06/17 1105 123/78     Pulse Rate 01/06/17 1105 84     Resp 01/06/17 1105 16     Temp 01/06/17 1105 98.5 F (36.9 C)     Temp Source 01/06/17 1105 Oral     SpO2 01/06/17 1105 99 %     Weight --      Height --      Head Circumference --      Peak Flow --      Pain Score 01/06/17 1104 0     Pain Loc --      Pain Edu? --      Excl. in GC? --    No data found.   Updated Vital Signs BP 123/78 (BP Location: Left Arm)   Pulse 84   Temp 98.5 F (36.9 C) (Oral)   Resp 16   SpO2 99%    Physical Exam  Constitutional: She is oriented to person, place, and time. She appears  well-developed and well-nourished.  HENT:  Right Ear: External ear normal.  Left Ear: External ear normal.  Mouth/Throat: Oropharynx is clear and moist.  Eyes: Conjunctivae and EOM are normal. Pupils are equal, round, and reactive to light.  Neck: Normal range of motion. Neck supple.  Pulmonary/Chest: Effort normal.  Genitourinary:  Genitourinary Comments: Patient has dark menstrual blood in the vault The perineum shows diffuse symmetric erythematous consistent with yeast infection.  Musculoskeletal: Normal range of motion.  Neurological: She is alert and oriented to person, place, and time.  Skin: Skin is warm and dry.  Nursing note and vitals reviewed.    UC Treatments / Results  Labs (all labs ordered are listed, but only abnormal results are displayed) Labs Reviewed  POCT URINALYSIS DIP (DEVICE)  POCT PREGNANCY, URINE    EKG  EKG Interpretation None       Radiology No results found.  Procedures Procedures (including critical care time)  Medications Ordered in UC Medications - No data to display   Initial Impression / Assessment and Plan / UC Course  I have reviewed the triage vital signs and the nursing notes.  Pertinent labs & imaging results that were available during my care of the patient were reviewed by me and considered in my medical decision making (see chart for details).     Final Clinical Impressions(s) / UC Diagnoses   Final diagnoses:  Yeast vaginitis  Menstrual irregularity  Endometriosis    New Prescriptions New Prescriptions   FLUCONAZOLE (DIFLUCAN) 150 MG TABLET    Take 1 tablet (150 mg total) by mouth once. Repeat if needed     Elvina SidleLauenstein, Maeven Mcdougall, MD 01/06/17 1136

## 2017-01-09 MED FILL — TOPIRAMATE 50 MG TABLET: 50 | 30 days supply | Qty: 60 | Fill #2

## 2017-01-09 MED FILL — CEPHALEXIN 500 MG CAPSULE: 500 | 30 days supply | Qty: 60 | Fill #2

## 2017-01-12 ENCOUNTER — Telehealth: Payer: 59 | Admitting: Family

## 2017-01-12 DIAGNOSIS — B373 Candidiasis of vulva and vagina: Secondary | ICD-10-CM | POA: Diagnosis not present

## 2017-01-12 DIAGNOSIS — B3731 Acute candidiasis of vulva and vagina: Secondary | ICD-10-CM

## 2017-01-12 MED ORDER — FLUCONAZOLE 150 MG PO TABS
150.0000 mg | ORAL_TABLET | Freq: Once | ORAL | 0 refills | Status: AC
Start: 2017-01-12 — End: 2017-01-12

## 2017-01-12 NOTE — Progress Notes (Signed)
We are sorry that you are not feeling well. Here is how we plan to help! Based on what you shared with me it looks like you: May have a yeast vaginosis  Vaginosis is an inflammation of the vagina that can result in discharge, itching and pain. The cause is usually a change in the normal balance of vaginal bacteria or an infection. Vaginosis can also result from reduced estrogen levels after menopause.  The most common causes of vaginosis are:   Bacterial vaginosis which results from an overgrowth of one on several organisms that are normally present in your vagina.   Yeast infections which are caused by a naturally occurring fungus called candida.   Vaginal atrophy (atrophic vaginosis) which results from the thinning of the vagina from reduced estrogen levels after menopause.   Trichomoniasis which is caused by a parasite and is commonly transmitted by sexual intercourse.  Factors that increase your risk of developing vaginosis include: Marland Kitchen Medications, such as antibiotics and steroids . Uncontrolled diabetes . Use of hygiene products such as bubble bath, vaginal spray or vaginal deodorant . Douching . Wearing damp or tight-fitting clothing . Using an intrauterine device (IUD) for birth control . Hormonal changes, such as those associated with pregnancy, birth control pills or menopause . Sexual activity . Having a sexually transmitted infection  Your treatment plan is A single Diflucan (fluconazole) 150mg  tablet once.  I have electronically sent this prescription into the pharmacy that you have chosen.   If this does not resolve you need to follow up with your primary care provider.  Be sure to take all of the medication as directed. Stop taking any medication if you develop a rash, tongue swelling or shortness of breath. Mothers who are breast feeding should consider pumping and discarding their breast milk while on these antibiotics. However, there is no consensus that infant exposure  at these doses would be harmful.  Remember that medication creams can weaken latex condoms. Marland Kitchen   HOME CARE:  Good hygiene may prevent some types of vaginosis from recurring and may relieve some symptoms:  . Avoid baths, hot tubs and whirlpool spas. Rinse soap from your outer genital area after a shower, and dry the area well to prevent irritation. Don't use scented or harsh soaps, such as those with deodorant or antibacterial action. Marland Kitchen Avoid irritants. These include scented tampons and pads. . Wipe from front to back after using the toilet. Doing so avoids spreading fecal bacteria to your vagina.  Other things that may help prevent vaginosis include:  Marland Kitchen Don't douche. Your vagina doesn't require cleansing other than normal bathing. Repetitive douching disrupts the normal organisms that reside in the vagina and can actually increase your risk of vaginal infection. Douching won't clear up a vaginal infection. . Use a latex condom. Both female and female latex condoms may help you avoid infections spread by sexual contact. . Wear cotton underwear. Also wear pantyhose with a cotton crotch. If you feel comfortable without it, skip wearing underwear to bed. Yeast thrives in [employer] Your symptoms should improve in the next day or two.  GET HELP RIGHT AWAY IF:  . You have pain in your lower abdomen ( pelvic area or over your ovaries) . You develop nausea or vomiting . You develop a fever . Your discharge changes or worsens . You have persistent pain with intercourse . You develop shortness of breath, a rapid pulse, or you faint.  These symptoms could be signs of problems or  infections that need to be evaluated by a medical provider now.  MAKE SURE YOU    Understand these instructions.  Will watch your condition.  Will get help right away if you are not doing well or get worse.  Your e-visit answers were reviewed by a board certified advanced clinical practitioner to complete  your personal care plan. Depending upon the condition, your plan could have included both over the counter or prescription medications. Please review your pharmacy choice to make sure that you have choses a pharmacy that is open for you to pick up any needed prescription, Your safety is important to us. If you have drug allergies check your prescription carefully.   You can use MyChart to ask questions about today's visit, request a non-urgent call back, or ask for a work or school excuse for 24 hours related to this e-Visit. If it has been greater than 24 hours you will need to follow up with your provider, or enter a new e-Visit to address those concerns. You will get a MyChart message within the next two days asking about your experience. I hope that your e-visit has been valuable and will speed your recovery.

## 2017-01-24 DIAGNOSIS — Z113 Encounter for screening for infections with a predominantly sexual mode of transmission: Secondary | ICD-10-CM | POA: Diagnosis not present

## 2017-01-24 DIAGNOSIS — Z6828 Body mass index (BMI) 28.0-28.9, adult: Secondary | ICD-10-CM | POA: Diagnosis not present

## 2017-01-24 DIAGNOSIS — Z01419 Encounter for gynecological examination (general) (routine) without abnormal findings: Secondary | ICD-10-CM | POA: Diagnosis not present

## 2017-02-07 MED FILL — SERTRALINE HCL 100 MG TAB: 100 | 30 days supply | Qty: 30 | Fill #0

## 2017-02-11 MED FILL — NORETHIN-ESTRAD-FERR 1-0.02: 1-20 | 28 days supply | Qty: 28 | Fill #0

## 2017-03-06 ENCOUNTER — Encounter: Payer: Self-pay | Admitting: Internal Medicine

## 2017-03-06 MED ORDER — SERTRALINE HCL 100 MG PO TABS: 150.0000 mg | ORAL_TABLET | Freq: Every day | ORAL | 5 refills | Status: DC

## 2017-03-06 MED ORDER — SERTRALINE HCL 100 MG PO TABS: ORAL_TABLET | ORAL | 5 refills | Status: DC

## 2017-03-06 MED FILL — SERTRALINE HCL 100 MG TAB: 100 | 30 days supply | Qty: 45 | Fill #0

## 2017-03-06 MED FILL — CLINDAMYCIN PH 1% GEL: 1 | 30 days supply | Qty: 60 | Fill #4

## 2017-03-19 MED FILL — NORETHIN-ESTRAD-FERR 1-0.02: 1-20 | 84 days supply | Qty: 84 | Fill #1

## 2017-03-22 IMAGING — CT CT NECK W/ CM
4 of 5 series · 16 of 33 positions shown, 18 images · IV contrast (ISOVUE 300)
Comparison: None.

CLINICAL DATA: Mononucleosis diagnosis 1 week ago. Fullness when
swallowing and left-sided throat pain. Elevated white count.

EXAM:
CT NECK WITH CONTRAST
TECHNIQUE: Multidetector CT imaging of the neck was performed using the
standard protocol following the bolus administration of intravenous
contrast.
CONTRAST:  75mL 9Z5A88-QCC IOPAMIDOL (9Z5A88-QCC) INJECTION 61%

[Series 3: neck w 2.0 i31s 2 · axial · 0.45mm/px · z∈[-192,-54]mm · 4 of 117 slices shown, 5 images]
[im 24/117  soft-tissue]
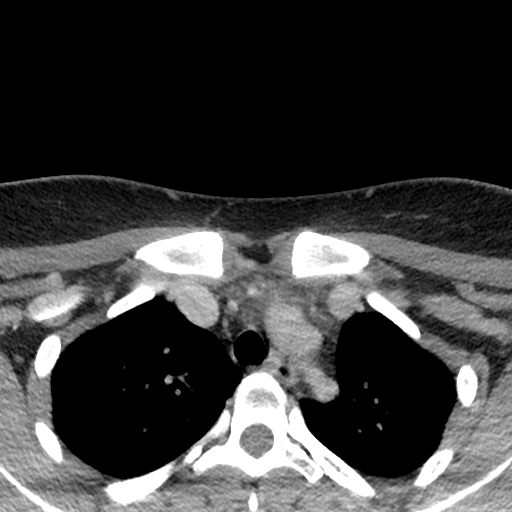
[im 24/117  bone]
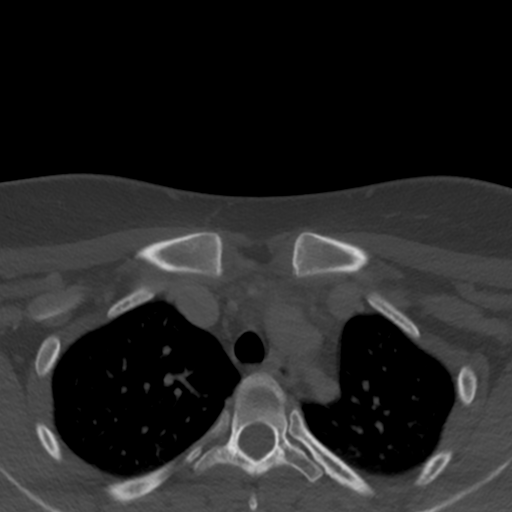
[im 47/117  bone]
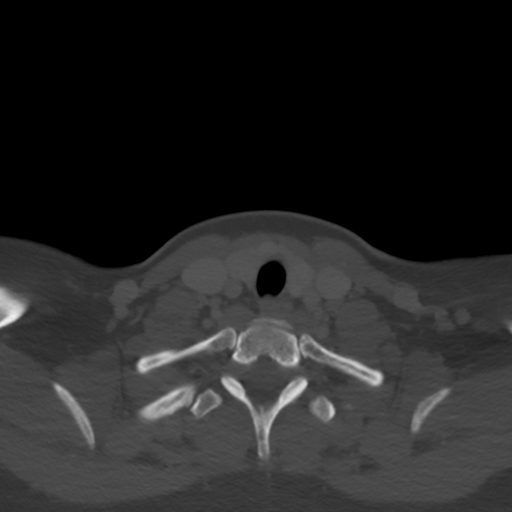
[im 70/117  bone]
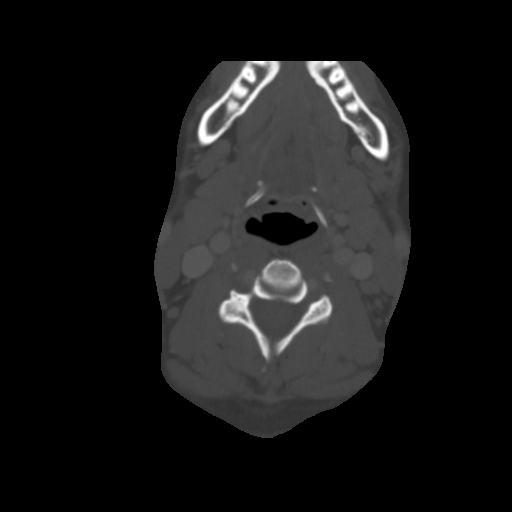
[im 93/117  bone]
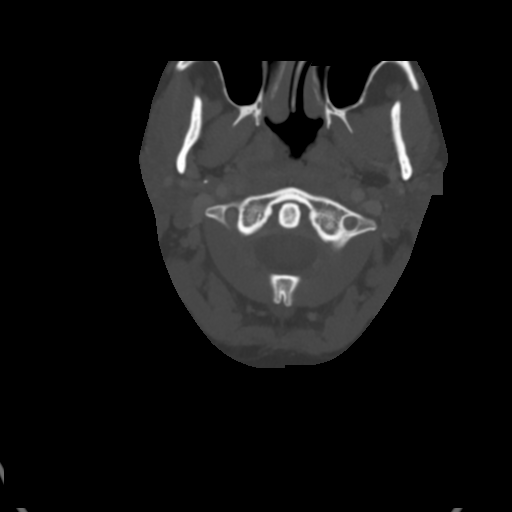

[Series 6: coronal st · coronal · 0.46mm/px · 3 of 118 slices shown]
[im 24/118  bone]
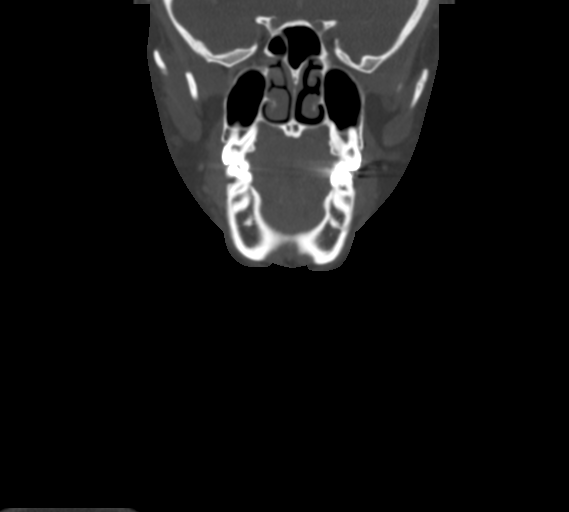
[im 47/118  bone]
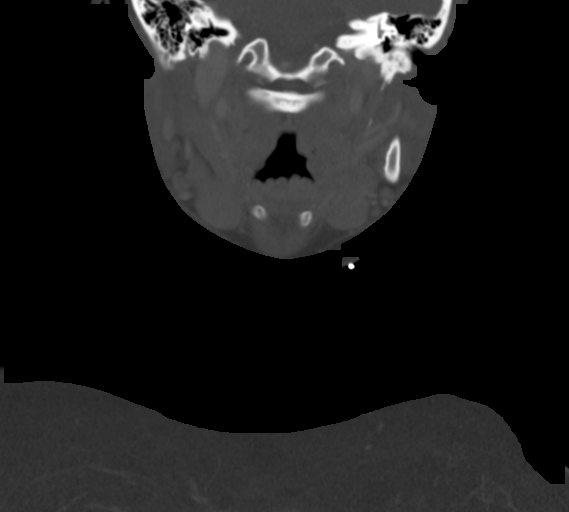
[im 71/118  bone]
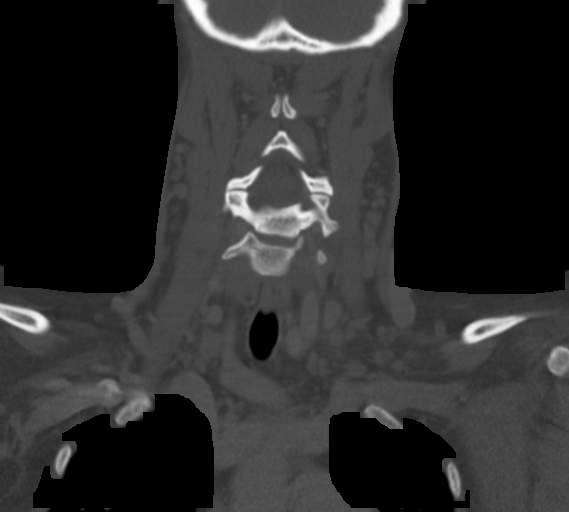

[Series 7: sagittal st · sagittal · 0.46mm/px · 5 of 133 slices shown, 6 images]
[im 45/133  bone]
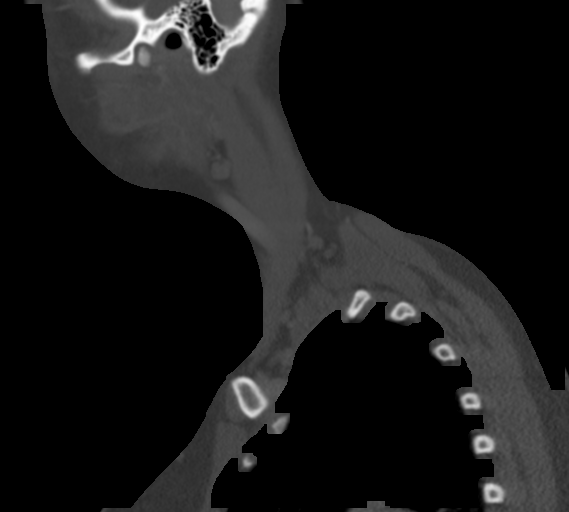
[im 56/133  bone]
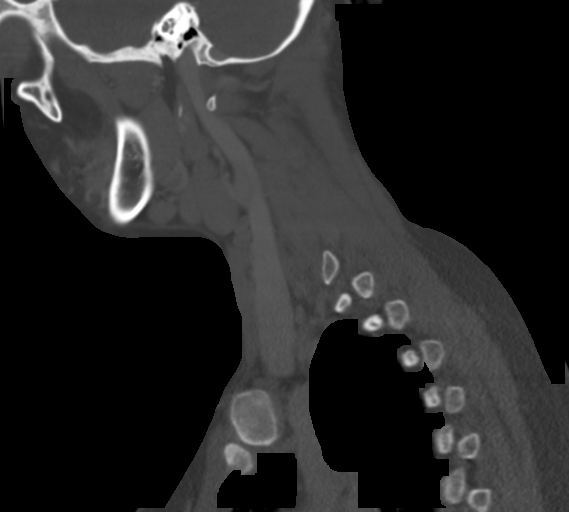
[im 67/133  soft-tissue]
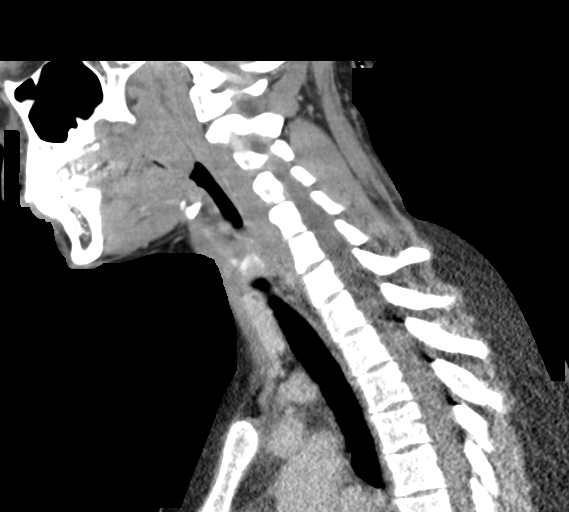
[im 67/133  bone]
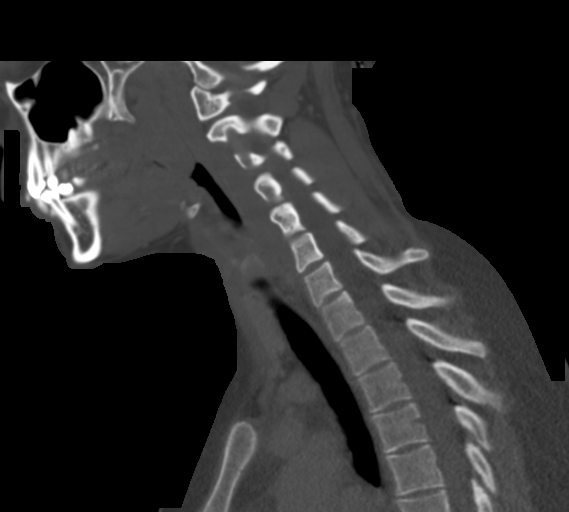
[im 78/133  bone]
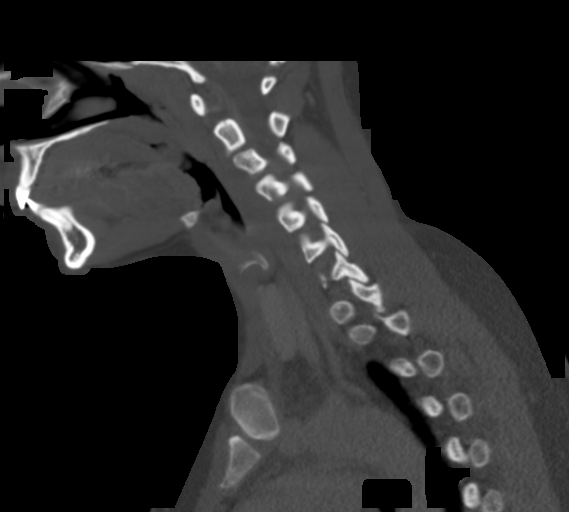
[im 89/133  bone]
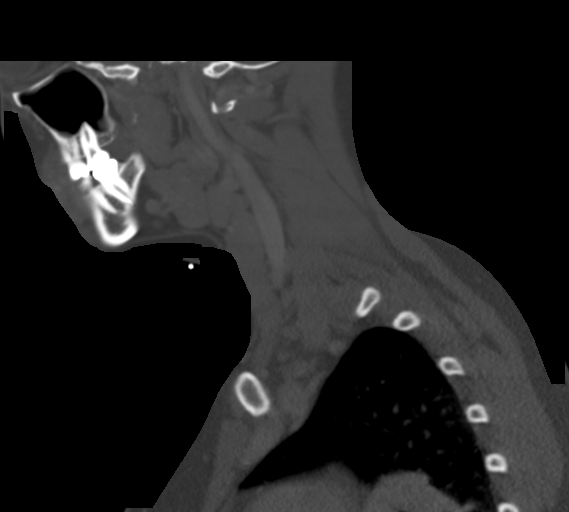

[Series 8: orthogonal st · axial · 0.39mm/px · z∈[-236,-84]mm · 4 of 142 slices shown]
[im 29/142  bone]
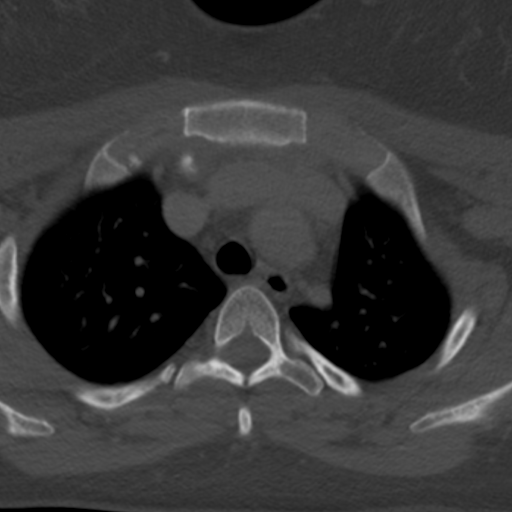
[im 57/142  bone]
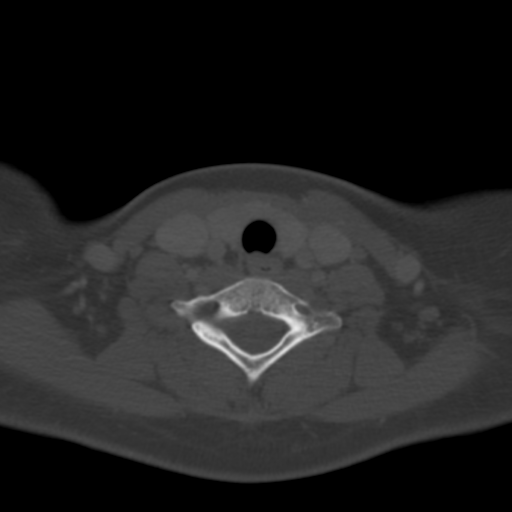
[im 85/142  bone]
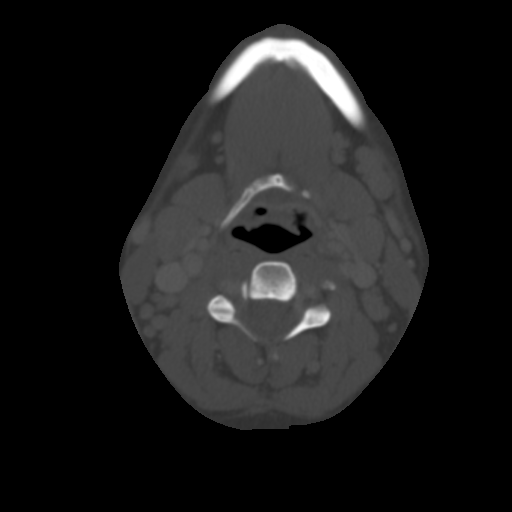
[im 113/142  bone]
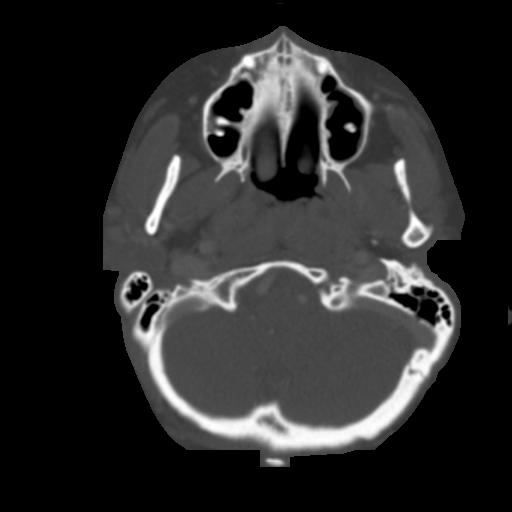

[16 of 33 positions shown; findings below may reference images not displayed]

FINDINGS: Pharynx and larynx: Tonsils are slightly prominent, consistent with
tonsillitis. No evidence of tonsillar or peritonsillar abscess. No
other mucosal finding.

Salivary glands: Submandibular and parotid glands are normal.

Thyroid: Normal.

Lymph nodes: Prominent lymph nodes throughout all nodal stations in
the neck consistent with reactive lymphadenopathy in the setting of
systemic infection. Largest nodes are level 2 nodes, level 2 node on
the right measuring 16 x 22 x 38 mm. No low-density nodes.

Vascular: Normal

Limited intracranial: Normal

Visualized orbits: Normal

Mastoids and visualized paranasal sinuses: Clear

Skeleton: Normal

Upper chest: Normal
IMPRESSION: Bilateral tonsillar prominence that could go along with tonsillitis.
No evidence of tonsillar or peritonsillar abscess.

Prominent lymph nodes throughout all nodal stations bilaterally
consistent with reactive nodal enlargement to systemic infection. No
low-density nodes.

## 2017-03-26 DIAGNOSIS — H5213 Myopia, bilateral: Secondary | ICD-10-CM | POA: Diagnosis not present

## 2017-04-18 MED FILL — CEPHALEXIN 500 MG CAPSULE: 500 | 30 days supply | Qty: 60 | Fill #3

## 2017-04-18 MED FILL — CLINDAMYCIN PH 1% GEL: 1 | 30 days supply | Qty: 60 | Fill #5

## 2017-04-18 MED FILL — SERTRALINE HCL 100 MG TAB: 100 | 30 days supply | Qty: 45 | Fill #1

## 2017-04-18 MED FILL — TOPIRAMATE 50 MG TABLET: 50 | 30 days supply | Qty: 60 | Fill #3

## 2017-06-12 MED FILL — CEPHALEXIN 500 MG CAPSULE: 500 | 30 days supply | Qty: 60 | Fill #4

## 2017-06-12 MED FILL — SERTRALINE HCL 100 MG TAB: 100 | 30 days supply | Qty: 45 | Fill #2

## 2017-06-12 MED FILL — NORETHIN-ESTRAD-FERR 1-0.02: 1-20 | 84 days supply | Qty: 84 | Fill #2

## 2017-06-12 MED FILL — TOPIRAMATE 50 MG TABLET: 50 | 30 days supply | Qty: 60 | Fill #4

## 2017-06-13 MED FILL — CLINDAMYCIN PH 1% GEL: 1 | 30 days supply | Qty: 60 | Fill #0

## 2017-06-22 DIAGNOSIS — Z23 Encounter for immunization: Secondary | ICD-10-CM | POA: Diagnosis not present

## 2017-06-24 ENCOUNTER — Telehealth: Payer: 59 | Admitting: Family

## 2017-06-24 DIAGNOSIS — R197 Diarrhea, unspecified: Secondary | ICD-10-CM | POA: Diagnosis not present

## 2017-06-24 MED ORDER — CIPROFLOXACIN HCL 500 MG PO TABS: 500.0000 mg | ORAL_TABLET | Freq: Two times a day (BID) | ORAL | 0 refills | Status: DC

## 2017-06-24 NOTE — Progress Notes (Signed)
We are sorry that you are not feeling well.  Here is how we plan to help!  Based on what you have shared with me it looks like you have Acute Infectious Diarrhea.  Most cases of acute diarrhea are due to infections with virus and bacteria and are self-limited conditions lasting less than 14 days.  For your symptoms you may take Imodium 2 mg tablets that are over the counter at your local pharmacy. Take two tablet now and then one after each loose stool up to 6 a day.  Antibiotics are not needed for most people with diarrhea.   I have sent in Cipro 500 mg two tablets twice a day for five days.  HOME CARE  We recommend changing your diet to help with your symptoms for the next few days.  Drink plenty of fluids that contain water salt and sugar. Sports drinks such as Gatorade may help.   You may try broths, soups, bananas, applesauce, soft breads, mashed potatoes or crackers.   You are considered infectious for as long as the diarrhea continues. Hand washing or use of alcohol based hand sanitizers is recommend.  It is best to stay out of work or school until your symptoms stop.   GET HELP RIGHT AWAY  If you have dark yellow colored urine or do not pass urine frequently you should drink more fluids.    If your symptoms worsen   If you feel like you are going to pass out (faint)  You have a new problem  MAKE SURE YOU   Understand these instructions.  Will watch your condition.  Will get help right away if you are not doing well or get worse.  Your e-visit answers were reviewed by a board certified advanced clinical practitioner to complete your personal care plan.  Depending on the condition, your plan could have included both over the counter or prescription medications.  If there is a problem please reply  once you have received a response from your provider.  Your safety is important to us.  If you have drug allergies check your prescription carefully.    You can use  MyChart to ask questions about today's visit, request a non-urgent call back, or ask for a work or school excuse for 24 hours related to this e-Visit. If it has been greater than 24 hours you will need to follow up with your provider, or enter a new e-Visit to address those concerns.   You will get an e-mail in the next two days asking about your experience.  I hope that your e-visit has been valuable and will speed your recovery. Thank you for using e-visits.   

## 2017-08-04 MED FILL — SERTRALINE HCL 100 MG TAB: 100 | 30 days supply | Qty: 45 | Fill #3

## 2017-08-22 MED FILL — CLINDAMYCIN PH 1% GEL: 1 | 30 days supply | Qty: 60 | Fill #1

## 2017-09-01 MED FILL — SERTRALINE HCL 100 MG TAB: 100 | 30 days supply | Qty: 45 | Fill #4

## 2017-10-27 ENCOUNTER — Other Ambulatory Visit: Payer: Self-pay | Admitting: Diagnostic Neuroimaging

## 2017-10-27 MED FILL — CLINDAMYCIN PH 1% GEL: 1 | 30 days supply | Qty: 60 | Fill #2

## 2017-10-27 MED FILL — SERTRALINE HCL 100 MG TAB: 100 | 30 days supply | Qty: 45 | Fill #5

## 2017-10-28 MED FILL — CEPHALEXIN 500 MG CAPSULE: 500 | 30 days supply | Qty: 60 | Fill #0

## 2017-10-29 ENCOUNTER — Other Ambulatory Visit: Payer: Self-pay | Admitting: Diagnostic Neuroimaging

## 2017-11-06 ENCOUNTER — Telehealth: Payer: 59 | Admitting: Physician Assistant

## 2017-11-06 DIAGNOSIS — J069 Acute upper respiratory infection, unspecified: Secondary | ICD-10-CM

## 2017-11-06 DIAGNOSIS — B9789 Other viral agents as the cause of diseases classified elsewhere: Secondary | ICD-10-CM | POA: Diagnosis not present

## 2017-11-06 DIAGNOSIS — J029 Acute pharyngitis, unspecified: Secondary | ICD-10-CM | POA: Diagnosis not present

## 2017-11-06 MED ORDER — FLUTICASONE PROPIONATE 50 MCG/ACT NA SUSP: 2.0000 | Freq: Every day | NASAL | 0 refills | Status: DC

## 2017-11-06 MED ORDER — BENZONATATE 100 MG PO CAPS: 100.0000 mg | ORAL_CAPSULE | Freq: Three times a day (TID) | ORAL | 0 refills | Status: DC | PRN

## 2017-11-06 NOTE — Progress Notes (Signed)

## 2017-12-12 ENCOUNTER — Other Ambulatory Visit: Payer: Self-pay | Admitting: Internal Medicine

## 2017-12-12 MED FILL — SERTRALINE HCL 100 MG TAB: 100 | 30 days supply | Qty: 45 | Fill #0

## 2018-01-02 MED FILL — CLINDAMYCIN PH 1% GEL: 1 | 30 days supply | Qty: 60 | Fill #3

## 2018-01-27 NOTE — Progress Notes (Signed)
Subjective:    Patient ID: Jennifer Parks, female    DOB: 10-26-1990, 26 y.o.   MRN: [id number]  HPI She is here for a physical exam.  She is moving to [place] next week.    She has no major concerns.  She does need refills of her prescriptions.  She has migraines and typically takes Topamax daily and that controls her migraines well.  She has been out of the Topamax for short period of time and has been experiencing some migraines.  She also takes sertraline daily and that controls anxiety depression well.  She is happy with her medications.  Since she will be moving she needs refills and will establish with a new PCP in the next few months.  Medications and allergies reviewed with patient and updated if appropriate.  Patient Active Problem List   Diagnosis Date Noted  . Itching 04/04/2016  . Anxiety 11/21/2015  . Eating disorder 11/21/2015  . Attention deficit hyperactivity disorder (ADHD) 11/21/2015  . Endometriosis 11/12/2013  . Migraines   . Depression   . Acne     Current Outpatient Medications on File Prior to Visit  Medication Sig Dispense Refill  . cephALEXin (KEFLEX) 500 MG capsule Take 500 mg by mouth 2 (two) times daily.  5  . clindamycin (CLINDAGEL) 1 % gel APPLY TO THE AFFECTED AREA(S) TWICE DAILY  4   No current facility-administered medications on file prior to visit.     Past Medical History:  Diagnosis Date  . Acne   . Anxiety   . Depression   . Endometriosis   . Headache    takes ibuprofen    Past Surgical History:  Procedure Laterality Date  . BREAST REDUCTION SURGERY Bilateral 12/20/2015   Procedure: BILATERAL BREAST REDUCTION AND LIPOSUCTION;  Surgeon: Peggye Form, DO;  [facility];  Service: Plastics;  Laterality: Bilateral;  . CYSTOSCOPY N/A 11/11/2013   Procedure: CYSTOSCOPY;  Surgeon: Zelphia Cairo, MD;  Location: [facility];  Service: Gynecology;  Laterality: N/A;  . LAPAROSCOPY N/A 11/09/2013   Procedure:  LAPAROSCOPY OPERATIVE WITH FULGERATION OF ENDOMETRIOSIS;  Surgeon: Zelphia Cairo, MD;  Location: [facility];  Service: Gynecology;  Laterality: N/A;  . LAPAROTOMY  11/11/2013   Procedure: MINI LAPAROTOMY;  Surgeon: Zelphia Cairo, MD;  Location: [facility];  Service: Gynecology;;  . [place] TOOTH EXTRACTION      Social History   Socioeconomic History  . Marital status: Single    Spouse name: Not on file  . Number of children: Not on file  . Years of education: Not on file  . Highest education level: Not on file  Occupational History  . Not on file  Social Needs  . Financial resource strain: Not on file  . Food insecurity:    Worry: Not on file    Inability: Not on file  . Transportation needs:    Medical: Not on file    Non-medical: Not on file  Tobacco Use  . Smoking status: Never Smoker  . Smokeless tobacco: Never Used  . Tobacco comment: single - student at [employer]  Substance and Sexual Activity  . Alcohol use: Yes    Comment: a few glasses of wine per week  . Drug use: No  . Sexual activity: Yes    Birth control/protection: Pill  Lifestyle  . Physical activity:    Days per week: Not on file    Minutes per session: Not on file  . Stress: Not on  file  Relationships  . Social connections:    Talks on phone: Not on file    Gets together: Not on file    Attends religious service: Not on file    Active member of club or organization: Not on file    Attends meetings of clubs or organizations: Not on file    Relationship status: Not on file  Other Topics Concern  . Not on file  Social History Narrative  . Not on file    Family History  Problem Relation Age of Onset  . Mental illness Mother   . Hyperlipidemia Father   . Lung cancer Maternal Grandfather   . COPD Maternal Grandfather   . Mental illness Paternal Grandmother   . COPD Paternal Grandmother   . Hyperlipidemia Paternal Grandfather   . Heart disease Paternal Grandfather     Review of Systems  Constitutional:  Negative for chills and fever.  Eyes: Negative for visual disturbance.  Respiratory: Negative for cough, shortness of breath and wheezing.   Cardiovascular: Negative for chest pain, palpitations and leg swelling.  Gastrointestinal: Negative for abdominal pain, blood in stool, constipation, diarrhea and nausea.       No gerd  Genitourinary: Positive for pelvic pain (intermittent endometriosis pain). Negative for dysuria and hematuria.  Musculoskeletal: Negative for arthralgias and back pain.  Skin: Negative for color change and rash.  Neurological: Positive for headaches (migraines). Negative for light-headedness (with migraines).  Psychiatric/Behavioral: Positive for dysphoric mood. The patient is nervous/anxious.        Objective:   Vitals:   01/28/18 1507  BP: 112/70  Pulse: 93  Resp: 16  Temp: 98.8 F (37.1 C)  SpO2: 98%   Filed Weights   01/28/18 1507  Weight: 185 lb (83.9 kg)   Body mass index is 31.76 kg/m.  Wt Readings from Last 3 Encounters:  01/28/18 185 lb (83.9 kg)  01/03/17 175 lb (79.4 kg)  08/02/16 173 lb 12.8 oz (78.8 kg)     Physical Exam Constitutional: She appears well-developed and well-nourished. No distress.  HENT:  Head: Normocephalic and atraumatic.  Right Ear: External ear normal. Normal ear canal and TM Left Ear: External ear normal.  Normal ear canal and TM Mouth/Throat: Oropharynx is clear and moist.  Eyes: Conjunctivae and EOM are normal.  Neck: Neck supple. No tracheal deviation present. No thyromegaly present.  No carotid bruit  Cardiovascular: Normal rate, regular rhythm and normal heart sounds.   No murmur heard.  No edema. Pulmonary/Chest: Effort normal and breath sounds normal. No respiratory distress. She has no wheezes. She has no rales.  Breast: deferred to Gyn Abdominal: Soft. She exhibits no distension. There is no tenderness.  Lymphadenopathy: She has no cervical adenopathy.  Skin: Skin is warm and dry. She is not  diaphoretic.  Psychiatric: She has a normal mood and affect. Her behavior is normal.        Assessment & Plan:   Physical exam: Screening blood work deferred Immunizations  Up to date  Gyn  No, but will wait until she moves to reestablish with a new gynecologist Exercise  Starting to exercise regularly-she is hoping that moving and starting a new job will give her a better work life balance so that she can exercise on a regular basis Weight  Has gained weight- trying to lose weight-she plans on adopting a more healthy lifestyle after moving Skin-no skin concerns Substance abuse   none  See Problem List for Assessment and Plan of chronic  medical problems.   No follow-up.  She will establish with a new primary care physician after moving [place]

## 2018-01-27 NOTE — Patient Instructions (Addendum)
All other Health Maintenance issues reviewed.   All recommended immunizations and age-appropriate screenings are up-to-date or discussed.  No immunizations administered today.   Medications reviewed and updated.  No changes recommended at this time.  Your prescription(s) have been submitted to your pharmacy. Please take as directed and contact our office if you believe you are having problem(s) with the medication(s).    Health Maintenance, Female Adopting a healthy lifestyle and getting preventive care can go a long way to promote health and wellness. Talk with your health care provider about what schedule of regular examinations is right for you. This is a good chance for you to check in with your provider about disease prevention and staying healthy. In between checkups, there are plenty of things you can do on your own. Experts have done a lot of research about which lifestyle changes and preventive measures are most likely to keep you healthy. Ask your health care provider for more information. Weight and diet Eat a healthy diet  Be sure to include plenty of vegetables, fruits, low-fat dairy products, and lean protein.  Do not eat a lot of foods high in solid fats, added sugars, or salt.  Get regular exercise. This is one of the most important things you can do for your health. ? Most adults should exercise for at least 150 minutes each week. The exercise should increase your heart rate and make you sweat (moderate-intensity exercise). ? Most adults should also do strengthening exercises at least twice a week. This is in addition to the moderate-intensity exercise.  Maintain a healthy weight  Body mass index (BMI) is a measurement that can be used to identify possible weight problems. It estimates body fat based on height and weight. Your health care provider can help determine your BMI and help you achieve or maintain a healthy weight.  For females 30 years of age and older: ? A  BMI below 18.5 is considered underweight. ? A BMI of 18.5 to 24.9 is normal. ? A BMI of 25 to 29.9 is considered overweight. ? A BMI of 30 and above is considered obese.  Watch levels of cholesterol and blood lipids  You should start having your blood tested for lipids and cholesterol at 26 years of age, then have this test every 5 years.  You may need to have your cholesterol levels checked more often if: ? Your lipid or cholesterol levels are high. ? You are older than 26 years of age. ? You are at high risk for heart disease.  Cancer screening Lung Cancer  Lung cancer screening is recommended for adults 71-7 years old who are at high risk for lung cancer because of a history of smoking.  A yearly low-dose CT scan of the lungs is recommended for people who: ? Currently smoke. ? Have quit within the past 15 years. ? Have at least a 30-pack-year history of smoking. A pack year is smoking an average of one pack of cigarettes a day for 1 year.  Yearly screening should continue until it has been 15 years since you quit.  Yearly screening should stop if you develop a health problem that would prevent you from having lung cancer treatment.  Breast Cancer  Practice breast self-awareness. This means understanding how your breasts normally appear and feel.  It also means doing regular breast self-exams. Let your health care provider know about any changes, no matter how small.  If you are in your 20s or 30s, you should have a  clinical breast exam (CBE) by a health care provider every 1-3 years as part of a regular health exam.  If you are 43 or older, have a CBE every year. Also consider having a breast X-ray (mammogram) every year.  If you have a family history of breast cancer, talk to your health care provider about genetic screening.  If you are at high risk for breast cancer, talk to your health care provider about having an MRI and a mammogram every year.  Breast cancer gene  (BRCA) assessment is recommended for women who have family members with BRCA-related cancers. BRCA-related cancers include: ? Breast. ? Ovarian. ? Tubal. ? Peritoneal cancers.  Results of the assessment will determine the need for genetic counseling and BRCA1 and BRCA2 testing.  Cervical Cancer Your health care provider may recommend that you be screened regularly for cancer of the pelvic organs (ovaries, uterus, and vagina). This screening involves a pelvic examination, including checking for microscopic changes to the surface of your cervix (Pap test). You may be encouraged to have this screening done every 3 years, beginning at age 67.  For women ages 68-65, health care providers may recommend pelvic exams and Pap testing every 3 years, or they may recommend the Pap and pelvic exam, combined with testing for human papilloma virus (HPV), every 5 years. Some types of HPV increase your risk of cervical cancer. Testing for HPV may also be done on women of any age with unclear Pap test results.  Other health care providers may not recommend any screening for nonpregnant women who are considered low risk for pelvic cancer and who do not have symptoms. Ask your health care provider if a screening pelvic exam is right for you.  If you have had past treatment for cervical cancer or a condition that could lead to cancer, you need Pap tests and screening for cancer for at least 20 years after your treatment. If Pap tests have been discontinued, your risk factors (such as having a new sexual partner) need to be reassessed to determine if screening should resume. Some women have medical problems that increase the chance of getting cervical cancer. In these cases, your health care provider may recommend more frequent screening and Pap tests.  Colorectal Cancer  This type of cancer can be detected and often prevented.  Routine colorectal cancer screening usually begins at 26 years of age and continues  through 26 years of age.  Your health care provider may recommend screening at an earlier age if you have risk factors for colon cancer.  Your health care provider may also recommend using home test kits to check for hidden blood in the stool.  A small camera at the end of a tube can be used to examine your colon directly (sigmoidoscopy or colonoscopy). This is done to check for the earliest forms of colorectal cancer.  Routine screening usually begins at age 16.  Direct examination of the colon should be repeated every 5-10 years through 26 years of age. However, you may need to be screened more often if early forms of precancerous polyps or small growths are found.  Skin Cancer  Check your skin from head to toe regularly.  Tell your health care provider about any new moles or changes in moles, especially if there is a change in a mole's shape or color.  Also tell your health care provider if you have a mole that is larger than the size of a pencil eraser.  Always use sunscreen.  Apply sunscreen liberally and repeatedly throughout the day.  Protect yourself by wearing long sleeves, pants, a wide-brimmed hat, and sunglasses whenever you are outside.  Heart disease, diabetes, and high blood pressure  High blood pressure causes heart disease and increases the risk of stroke. High blood pressure is more likely to develop in: ? People who have blood pressure in the high end of the normal range (130-139/85-89 mm Hg). ? People who are overweight or obese. ? People who are African American.  If you are 34-16 years of age, have your blood pressure checked every 3-5 years. If you are 63 years of age or older, have your blood pressure checked every year. You should have your blood pressure measured twice-once when you are at a hospital or clinic, and once when you are not at a hospital or clinic. Record the average of the two measurements. To check your blood pressure when you are not at a  hospital or clinic, you can use: ? An automated blood pressure machine at a pharmacy. ? A home blood pressure monitor.  If you are between 61 years and 42 years old, ask your health care provider if you should take aspirin to prevent strokes.  Have regular diabetes screenings. This involves taking a blood sample to check your fasting blood sugar level. ? If you are at a normal weight and have a low risk for diabetes, have this test once every three years after 26 years of age. ? If you are overweight and have a high risk for diabetes, consider being tested at a younger age or more often. Preventing infection Hepatitis B  If you have a higher risk for hepatitis B, you should be screened for this virus. You are considered at high risk for hepatitis B if: ? You were born in a country where hepatitis B is common. Ask your health care provider which countries are considered high risk. ? Your parents were born in a high-risk country, and you have not been immunized against hepatitis B (hepatitis B vaccine). ? You have HIV or AIDS. ? You use needles to inject street drugs. ? You live with someone who has hepatitis B. ? You have had sex with someone who has hepatitis B. ? You get hemodialysis treatment. ? You take certain medicines for conditions, including cancer, organ transplantation, and autoimmune conditions.  Hepatitis C  Blood testing is recommended for: ? Everyone born from [birth year] through 1964. ? Anyone with known risk factors for hepatitis C.  Sexually transmitted infections (STIs)  You should be screened for sexually transmitted infections (STIs) including gonorrhea and chlamydia if: ? You are sexually active and are younger than 26 years of age. ? You are older than 26 years of age and your health care provider tells you that you are at risk for this type of infection. ? Your sexual activity has changed since you were last screened and you are at an increased risk for chlamydia or  gonorrhea. Ask your health care provider if you are at risk.  If you do not have HIV, but are at risk, it may be recommended that you take a prescription medicine daily to prevent HIV infection. This is called pre-exposure prophylaxis (PrEP). You are considered at risk if: ? You are sexually active and do not regularly use condoms or know the HIV status of your partner(s). ? You take drugs by injection. ? You are sexually active with a partner who has HIV.  Talk with your health care  provider about whether you are at high risk of being infected with HIV. If you choose to begin PrEP, you should first be tested for HIV. You should then be tested every 3 months for as long as you are taking PrEP. Pregnancy  If you are premenopausal and you may become pregnant, ask your health care provider about preconception counseling.  If you may become pregnant, take 400 to 800 micrograms (mcg) of folic acid every day.  If you want to prevent pregnancy, talk to your health care provider about birth control (contraception). Osteoporosis and menopause  Osteoporosis is a disease in which the bones lose minerals and strength with aging. This can result in serious bone fractures. Your risk for osteoporosis can be identified using a bone density scan.  If you are 72 years of age or older, or if you are at risk for osteoporosis and fractures, ask your health care provider if you should be screened.  Ask your health care provider whether you should take a calcium or vitamin D supplement to lower your risk for osteoporosis.  Menopause may have certain physical symptoms and risks.  Hormone replacement therapy may reduce some of these symptoms and risks. Talk to your health care provider about whether hormone replacement therapy is right for you. Follow these instructions at home:  Schedule regular health, dental, and eye exams.  Stay current with your immunizations.  Do not use any tobacco products including  cigarettes, chewing tobacco, or electronic cigarettes.  If you are pregnant, do not drink alcohol.  If you are breastfeeding, limit how much and how often you drink alcohol.  Limit alcohol intake to no more than 1 drink per day for nonpregnant women. One drink equals 12 ounces of beer, 5 ounces of wine, or 1 ounces of hard liquor.  Do not use street drugs.  Do not share needles.  Ask your health care provider for help if you need support or information about quitting drugs.  Tell your health care provider if you often feel depressed.  Tell your health care provider if you have ever been abused or do not feel safe at home. This information is not intended to replace advice given to you by your health care provider. Make sure you discuss any questions you have with your health care provider. Document Released: 02/11/2011 Document Revised: 01/04/2016 Document Reviewed: 05/02/2015 Elsevier Interactive Patient Education  [employer].

## 2018-01-28 ENCOUNTER — Ambulatory Visit (INDEPENDENT_AMBULATORY_CARE_PROVIDER_SITE_OTHER): Payer: 59 | Admitting: Internal Medicine

## 2018-01-28 ENCOUNTER — Encounter: Payer: Self-pay | Admitting: Internal Medicine

## 2018-01-28 VITALS — BP 112/70 | HR 93 | Temp 98.8°F | Resp 16 | Ht 64.0 in | Wt 185.0 lb

## 2018-01-28 DIAGNOSIS — F419 Anxiety disorder, unspecified: Secondary | ICD-10-CM | POA: Diagnosis not present

## 2018-01-28 DIAGNOSIS — F3289 Other specified depressive episodes: Secondary | ICD-10-CM

## 2018-01-28 DIAGNOSIS — G43009 Migraine without aura, not intractable, without status migrainosus: Secondary | ICD-10-CM

## 2018-01-28 DIAGNOSIS — Z0000 Encounter for general adult medical examination without abnormal findings: Secondary | ICD-10-CM | POA: Diagnosis not present

## 2018-01-28 MED ORDER — TOPIRAMATE 50 MG PO TABS: 50.0000 mg | ORAL_TABLET | Freq: Every day | ORAL | 1 refills | Status: DC

## 2018-01-28 MED ORDER — SERTRALINE HCL 100 MG PO TABS: 150.0000 mg | ORAL_TABLET | Freq: Every day | ORAL | 1 refills | Status: DC

## 2018-01-28 MED FILL — SERTRALINE HCL 100 MG TAB: 100 | 90 days supply | Qty: 135 | Fill #0

## 2018-01-28 MED FILL — TOPIRAMATE 50 MG TABLET: 50 | 90 days supply | Qty: 90 | Fill #0

## 2018-01-28 NOTE — Assessment & Plan Note (Signed)
Well-controlled Continue current dose of sertraline

## 2018-01-28 NOTE — Assessment & Plan Note (Addendum)
Out of topamax and has been experiencing some migraines Her migraines were well controlled with the Topamax once a day-we will renew prescription

## 2018-01-28 NOTE — Assessment & Plan Note (Signed)
Well-controlled Continue current dose of sertraline Refilled

## 2018-02-18 ENCOUNTER — Encounter: Payer: 59 | Admitting: Internal Medicine

## 2018-03-10 MED FILL — CEPHALEXIN 500 MG CAPSULE: 500 | 30 days supply | Qty: 60 | Fill #1

## 2018-03-10 MED FILL — CLINDAMYCIN PH 1% GEL: 1 | 30 days supply | Qty: 60 | Fill #4

## 2018-06-17 ENCOUNTER — Other Ambulatory Visit: Payer: Self-pay | Admitting: Internal Medicine

## 2018-06-17 MED ORDER — SERTRALINE HCL 100 MG PO TABS: 150.0000 mg | ORAL_TABLET | Freq: Every day | ORAL | 0 refills | Status: AC

## 2018-06-17 MED ORDER — CLINDAMYCIN PHOSPHATE 1 % EX GEL: CUTANEOUS | 0 refills | Status: AC

## 2018-06-17 MED ORDER — TOPIRAMATE 50 MG PO TABS: 50.0000 mg | ORAL_TABLET | Freq: Every day | ORAL | 0 refills | Status: DC

## 2018-06-17 NOTE — Telephone Encounter (Signed)
Requested Prescriptions  Pending Prescriptions Disp Refills  . clindamycin (CLINDAGEL) 1 % gel 30 g 4    Sig: APPLY TO THE AFFECTED AREA(S) TWICE DAILY     Off-Protocol Failed - 06/17/2018  9:27 AM      Failed - Medication not assigned to a protocol, review manually.      Passed - Valid encounter within last 12 months    Recent Outpatient Visits          4 months ago Preventative health care   [facility] [facility], Bobette Mo, MD   1 year ago Other depression   [facility] [facility], Bobette Mo, MD   2 years ago Acute pharyngitis due to other specified organisms   [facility] -Clair Gulling, MD   2 years ago Acute pharyngitis due to infectious mononucleosis   [facility] [facility], Bonna Gains, NP   2 years ago Anxiety   [facility] [facility], Bobette Mo, MD           . sertraline (ZOLOFT) 100 MG tablet 135 tablet 0    Sig: Take 1.5 tablets (150 mg total) by mouth at bedtime.     Psychiatry:  Antidepressants - SSRI Passed - 06/17/2018  9:27 AM      Passed - Completed PHQ-2 or PHQ-9 in the last 360 days.      Passed - Valid encounter within last 6 months    Recent Outpatient Visits          4 months ago Preventative health care   [facility] [facility], Bobette Mo, MD   1 year ago Other depression   [facility] [facility], Bobette Mo, MD   2 years ago Acute pharyngitis due to other specified organisms   [facility] -Clair Gulling, MD   2 years ago Acute pharyngitis due to infectious mononucleosis   [facility] [facility], Bonna Gains, NP   2 years ago Anxiety   [facility] [facility], Bobette Mo, MD           . topiramate (TOPAMAX) 50 MG tablet 90 tablet 1    Sig: Take 1 tablet (50 mg total) by mouth daily.     Not Delegated - Neurology: Anticonvulsants -  topiramate & zonisamide Failed - 06/17/2018  9:27 AM      Failed - This refill cannot be delegated      Failed - Cr in normal range and within 360 days    Creatinine, Ser  Date Value Ref Range Status  08/02/2016 0.68 0.57 - 1.00 mg/dL Final         Failed - C02 in normal range and within 360 days    CO2  Date Value Ref Range Status  08/02/2016 27 18 - 29 mmol/L Final         Passed - Valid encounter within last 12 months    Recent Outpatient Visits          4 months ago Preventative health care   [facility] [facility], Bobette Mo, MD   1 year ago Other depression   [facility] [facility], Bobette Mo, MD   2 years ago Acute pharyngitis due to other specified organisms   [facility] -Clair Gulling, MD   2 years ago Acute pharyngitis due to  infectious mononucleosis   [facility] Bonna Gains, NP   2 years ago Anxiety   [facility] [facility], Bobette Mo, MD

## 2018-06-17 NOTE — Telephone Encounter (Signed)
Copied from CRM [id number]. Topic: Quick Communication - Rx Refill/Question >> Jun 17, 2018  8:43 AM Stephannie Li, NT wrote: Medication: clindamycin (CLINDAGEL) 1 % gel ,  sertraline (ZOLOFT) 100 MG tablet  and also topiramate (TOPAMAX) 50 MG tablet  Has the patient contacted their pharmacy? yes  (Agent: If no, request that the patient contact the pharmacy for the refill. (Agent: If yes, when and what did the pharmacy advise?  Preferred Pharmacy (with phone number or street name  [id number] - [place], [place] - [address] AT [facility] OF [address] [phone] (Phone) [phone] (Fax)    Agent: Please be advised that RX refills may take up to 3 business days. We ask that you follow-up with your pharmacy.

## 2018-06-17 NOTE — Telephone Encounter (Signed)
Refills sent to [facility] but she will need to establish with a new provider there for future refills

## 2018-06-17 NOTE — Telephone Encounter (Signed)
Requested medication (s) are due for refill today -yes  Requested medication (s) are on the active medication list -yes  Future visit scheduled -no  Last refill: 01/28/18   Notes to clinic: Patient is requesting refills:  Clindamycin 1% gel- listed as historical topiramate 50 mg - non-delegated Rx sent for provider review   Requested Prescriptions  Pending Prescriptions Disp Refills   clindamycin (CLINDAGEL) 1 % gel 30 g 4    Sig: APPLY TO THE AFFECTED AREA(S) TWICE DAILY     Off-Protocol Failed - 06/17/2018  9:27 AM      Failed - Medication not assigned to a protocol, review manually.      Passed - Valid encounter within last 12 months    Recent Outpatient Visits          4 months ago Preventative health care   [facility] [facility] Bobette Mo, MD   1 year ago Other depression   [facility] [facility], Bobette Mo, MD   2 years ago Acute pharyngitis due to other specified organisms   [facility] -Clair Gulling, MD   2 years ago Acute pharyngitis due to infectious mononucleosis   [facility] [facility], Bonna Gains, NP   2 years ago Anxiety   [facility] [facility], Bobette Mo, MD            topiramate (TOPAMAX) 50 MG tablet 90 tablet 1    Sig: Take 1 tablet (50 mg total) by mouth daily.     Not Delegated - Neurology: Anticonvulsants - topiramate & zonisamide Failed - 06/17/2018  9:27 AM      Failed - This refill cannot be delegated      Failed - Cr in normal range and within 360 days    Creatinine, Ser  Date Value Ref Range Status  08/02/2016 0.68 0.57 - 1.00 mg/dL Final         Failed - C02 in normal range and within 360 days    CO2  Date Value Ref Range Status  08/02/2016 27 18 - 29 mmol/L Final         Passed - Valid encounter within last 12 months    Recent Outpatient Visits          4 months ago Preventative health care   [facility] [facility], Bobette Mo, MD   1 year ago Other depression   [facility] [facility], Bobette Mo, MD   2 years ago Acute pharyngitis due to other specified organisms   [facility] -Clair Gulling, MD   2 years ago Acute pharyngitis due to infectious mononucleosis   [facility] [facility], Bonna Gains, NP   2 years ago Anxiety    [facility] [facility] Mo, MD           Signed Prescriptions Disp Refills   sertraline (ZOLOFT) 100 MG tablet 135 tablet 0    Sig: Take 1.5 tablets (150 mg total) by mouth at bedtime.     Psychiatry:  Antidepressants - SSRI Passed - 06/17/2018  9:27 AM      Passed - Completed PHQ-2 or PHQ-9 in the last 360 days.      Passed - Valid encounter within last 6 months    Recent Outpatient Visits          4 months ago Preventative health  care   [facility] [facility] Bobette Mo, MD   1 year ago Other depression   [facility] [facility], Bobette Mo, MD   2 years ago Acute pharyngitis due to other specified organisms   [facility] -Clair Gulling, MD   2 years ago Acute pharyngitis due to infectious mononucleosis   [facility] [facility], Bonna Gains, NP   2 years ago Anxiety   [facility] [facility], Bobette Mo, MD              Requested Prescriptions  Pending Prescriptions Disp Refills   clindamycin (CLINDAGEL) 1 % gel 30 g 4    Sig: APPLY TO THE AFFECTED AREA(S) TWICE DAILY     Off-Protocol Failed - 06/17/2018  9:27 AM      Failed - Medication not assigned to a protocol, review manually.      Passed - Valid encounter within last 12 months    Recent Outpatient Visits          4 months ago Preventative health care   [facility] [facility] Bobette Mo, MD   1 year ago Other depression   [facility] [facility], Bobette Mo,  MD   2 years ago Acute pharyngitis due to other specified organisms   [facility] -Clair Gulling, MD   2 years ago Acute pharyngitis due to infectious mononucleosis   [facility] [facility], Bonna Gains, NP   2 years ago Anxiety   [facility] [facility], Bobette Mo, MD            topiramate (TOPAMAX) 50 MG tablet 90 tablet 1    Sig: Take 1 tablet (50 mg total) by mouth daily.     Not Delegated - Neurology: Anticonvulsants - topiramate & zonisamide Failed - 06/17/2018  9:27 AM      Failed - This refill cannot be delegated      Failed - Cr in normal range and within 360 days    Creatinine, Ser  Date Value Ref Range Status  08/02/2016 0.68 0.57 - 1.00 mg/dL Final         Failed - C02 in normal range and within 360 days    CO2  Date Value Ref Range Status  08/02/2016 27 18 - 29 mmol/L Final         Passed - Valid encounter within last 12 months    Recent Outpatient Visits          4 months ago Preventative health care   [facility] [facility] Bobette Mo, MD   1 year ago Other depression   [facility] [facility], Bobette Mo, MD   2 years ago Acute pharyngitis due to other specified organisms   [facility] -Clair Gulling, MD   2 years ago Acute pharyngitis due to infectious mononucleosis   [facility] [facility], Bonna Gains, NP   2 years ago Anxiety    [facility] [facility] Bobette Mo, MD           Signed Prescriptions Disp Refills   sertraline (ZOLOFT) 100 MG tablet 135 tablet 0    Sig: Take 1.5 tablets (150 mg total) by mouth at bedtime.     Psychiatry:  Antidepressants - SSRI Passed - 06/17/2018  9:27 AM  Passed - Completed PHQ-2 or PHQ-9 in the last 360 days.      Passed - Valid encounter within last 6 months    Recent Outpatient Visits          4 months ago Preventative health care    [facility] [facility] Bobette Mo, MD   1 year ago Other depression   [facility] [facility], Bobette Mo, MD   2 years ago Acute pharyngitis due to other specified organisms   [facility] -Clair Gulling, MD   2 years ago Acute pharyngitis due to infectious mononucleosis   [facility] [facility], Bonna Gains, NP   2 years ago Anxiety   [facility] [facility], Bobette Mo, MD

## 2018-09-25 ENCOUNTER — Other Ambulatory Visit: Payer: Self-pay | Admitting: Internal Medicine

## 2020-12-04 NOTE — Telephone Encounter (Signed)
Error, please disregard.
